# Patient Record
Sex: Male | Born: 1975
Health system: Southern US, Community
[De-identification: ages and names within clinical notes are randomized; demographics above are authoritative.]

## PROBLEM LIST (undated history)

## (undated) DIAGNOSIS — G4733 Obstructive sleep apnea (adult) (pediatric): Secondary | ICD-10-CM

## (undated) DIAGNOSIS — F909 Attention-deficit hyperactivity disorder, unspecified type: Secondary | ICD-10-CM

## (undated) DIAGNOSIS — I10 Essential (primary) hypertension: Secondary | ICD-10-CM

## (undated) DIAGNOSIS — F419 Anxiety disorder, unspecified: Secondary | ICD-10-CM

## (undated) DIAGNOSIS — K219 Gastro-esophageal reflux disease without esophagitis: Secondary | ICD-10-CM

## (undated) HISTORY — DX: Gastro-esophageal reflux disease without esophagitis: K21.9

## (undated) HISTORY — DX: Anxiety disorder, unspecified: F41.9

## (undated) HISTORY — DX: Obstructive sleep apnea (adult) (pediatric): G47.33

## (undated) HISTORY — DX: Essential (primary) hypertension: I10

## (undated) HISTORY — PX: WISDOM TOOTH EXTRACTION: SHX21

---

## 2004-06-11 ENCOUNTER — Ambulatory Visit: Payer: Self-pay | Admitting: Internal Medicine

## 2004-08-12 ENCOUNTER — Ambulatory Visit: Payer: Self-pay | Admitting: Internal Medicine

## 2005-02-22 ENCOUNTER — Ambulatory Visit: Payer: Self-pay | Admitting: Internal Medicine

## 2005-04-06 ENCOUNTER — Ambulatory Visit: Payer: Self-pay | Admitting: Internal Medicine

## 2005-05-10 ENCOUNTER — Ambulatory Visit: Payer: Self-pay | Admitting: Internal Medicine

## 2005-06-03 ENCOUNTER — Ambulatory Visit: Payer: Self-pay | Admitting: Internal Medicine

## 2005-07-08 ENCOUNTER — Ambulatory Visit: Payer: Self-pay | Admitting: Internal Medicine

## 2005-08-18 ENCOUNTER — Ambulatory Visit: Payer: Self-pay | Admitting: Family Medicine

## 2005-09-08 ENCOUNTER — Ambulatory Visit: Payer: Self-pay | Admitting: Internal Medicine

## 2005-10-05 ENCOUNTER — Ambulatory Visit: Payer: Self-pay | Admitting: Internal Medicine

## 2006-05-15 ENCOUNTER — Ambulatory Visit: Payer: Self-pay | Admitting: Internal Medicine

## 2006-06-22 DIAGNOSIS — K219 Gastro-esophageal reflux disease without esophagitis: Secondary | ICD-10-CM

## 2006-06-22 DIAGNOSIS — J309 Allergic rhinitis, unspecified: Secondary | ICD-10-CM

## 2006-06-22 DIAGNOSIS — F419 Anxiety disorder, unspecified: Secondary | ICD-10-CM

## 2006-06-22 DIAGNOSIS — F909 Attention-deficit hyperactivity disorder, unspecified type: Secondary | ICD-10-CM | POA: Insufficient documentation

## 2007-03-05 ENCOUNTER — Ambulatory Visit: Payer: Self-pay | Admitting: Internal Medicine

## 2007-03-05 DIAGNOSIS — J019 Acute sinusitis, unspecified: Secondary | ICD-10-CM

## 2007-03-06 ENCOUNTER — Telehealth: Payer: Self-pay | Admitting: Internal Medicine

## 2007-04-06 ENCOUNTER — Telehealth (INDEPENDENT_AMBULATORY_CARE_PROVIDER_SITE_OTHER): Payer: Self-pay | Admitting: *Deleted

## 2007-04-16 ENCOUNTER — Telehealth (INDEPENDENT_AMBULATORY_CARE_PROVIDER_SITE_OTHER): Payer: Self-pay | Admitting: *Deleted

## 2007-04-19 ENCOUNTER — Telehealth (INDEPENDENT_AMBULATORY_CARE_PROVIDER_SITE_OTHER): Payer: Self-pay | Admitting: *Deleted

## 2007-04-20 ENCOUNTER — Ambulatory Visit: Payer: Self-pay | Admitting: Internal Medicine

## 2007-05-24 ENCOUNTER — Encounter (INDEPENDENT_AMBULATORY_CARE_PROVIDER_SITE_OTHER): Payer: Self-pay | Admitting: *Deleted

## 2007-06-11 ENCOUNTER — Telehealth (INDEPENDENT_AMBULATORY_CARE_PROVIDER_SITE_OTHER): Payer: Self-pay | Admitting: *Deleted

## 2007-07-26 ENCOUNTER — Ambulatory Visit: Payer: Self-pay | Admitting: Internal Medicine

## 2007-07-26 DIAGNOSIS — R002 Palpitations: Secondary | ICD-10-CM

## 2007-07-26 DIAGNOSIS — G4733 Obstructive sleep apnea (adult) (pediatric): Secondary | ICD-10-CM

## 2007-08-02 ENCOUNTER — Ambulatory Visit: Payer: Self-pay | Admitting: Pulmonary Disease

## 2007-08-02 DIAGNOSIS — I10 Essential (primary) hypertension: Secondary | ICD-10-CM

## 2007-08-04 ENCOUNTER — Encounter: Payer: Self-pay | Admitting: Pulmonary Disease

## 2007-08-04 ENCOUNTER — Ambulatory Visit (HOSPITAL_BASED_OUTPATIENT_CLINIC_OR_DEPARTMENT_OTHER): Admission: RE | Admit: 2007-08-04 | Discharge: 2007-08-04 | Payer: Self-pay | Admitting: Pulmonary Disease

## 2007-08-22 ENCOUNTER — Telehealth: Payer: Self-pay | Admitting: Pulmonary Disease

## 2007-08-30 ENCOUNTER — Ambulatory Visit: Payer: Self-pay | Admitting: Pulmonary Disease

## 2007-08-30 ENCOUNTER — Telehealth (INDEPENDENT_AMBULATORY_CARE_PROVIDER_SITE_OTHER): Payer: Self-pay | Admitting: *Deleted

## 2007-09-13 ENCOUNTER — Ambulatory Visit: Payer: Self-pay | Admitting: Pulmonary Disease

## 2007-09-25 ENCOUNTER — Telehealth (INDEPENDENT_AMBULATORY_CARE_PROVIDER_SITE_OTHER): Payer: Self-pay | Admitting: *Deleted

## 2007-11-30 ENCOUNTER — Telehealth: Payer: Self-pay | Admitting: Internal Medicine

## 2007-12-04 ENCOUNTER — Encounter (INDEPENDENT_AMBULATORY_CARE_PROVIDER_SITE_OTHER): Payer: Self-pay | Admitting: *Deleted

## 2007-12-21 ENCOUNTER — Ambulatory Visit: Payer: Self-pay | Admitting: Pulmonary Disease

## 2008-01-09 ENCOUNTER — Encounter: Payer: Self-pay | Admitting: Pulmonary Disease

## 2008-01-28 ENCOUNTER — Ambulatory Visit: Payer: Self-pay | Admitting: Internal Medicine

## 2008-02-01 ENCOUNTER — Ambulatory Visit: Payer: Self-pay | Admitting: Family Medicine

## 2008-02-01 DIAGNOSIS — IMO0002 Reserved for concepts with insufficient information to code with codable children: Secondary | ICD-10-CM

## 2008-02-01 DIAGNOSIS — M545 Low back pain, unspecified: Secondary | ICD-10-CM | POA: Insufficient documentation

## 2008-02-15 ENCOUNTER — Telehealth: Payer: Self-pay | Admitting: Internal Medicine

## 2008-05-29 ENCOUNTER — Ambulatory Visit: Payer: Self-pay | Admitting: Internal Medicine

## 2008-06-19 ENCOUNTER — Telehealth: Payer: Self-pay | Admitting: Internal Medicine

## 2008-07-04 ENCOUNTER — Encounter: Admission: RE | Admit: 2008-07-04 | Discharge: 2008-07-04 | Payer: Self-pay | Admitting: Family Medicine

## 2008-07-04 ENCOUNTER — Ambulatory Visit: Payer: Self-pay | Admitting: Family Medicine

## 2008-07-05 ENCOUNTER — Encounter: Admission: RE | Admit: 2008-07-05 | Discharge: 2008-07-05 | Payer: Self-pay | Admitting: Family Medicine

## 2008-07-07 ENCOUNTER — Telehealth: Payer: Self-pay | Admitting: Family Medicine

## 2008-07-15 ENCOUNTER — Encounter: Payer: Self-pay | Admitting: Family Medicine

## 2008-07-23 ENCOUNTER — Ambulatory Visit (HOSPITAL_COMMUNITY): Admission: RE | Admit: 2008-07-23 | Discharge: 2008-07-24 | Payer: Self-pay | Admitting: Neurosurgery

## 2008-07-25 ENCOUNTER — Telehealth: Payer: Self-pay | Admitting: Family Medicine

## 2008-07-26 HISTORY — PX: LUMBAR DISC SURGERY: SHX700

## 2008-08-12 ENCOUNTER — Telehealth: Payer: Self-pay | Admitting: Internal Medicine

## 2008-10-06 ENCOUNTER — Telehealth: Payer: Self-pay | Admitting: Family Medicine

## 2008-10-07 ENCOUNTER — Telehealth: Payer: Self-pay | Admitting: Internal Medicine

## 2008-10-13 ENCOUNTER — Encounter: Payer: Self-pay | Admitting: Internal Medicine

## 2008-11-21 ENCOUNTER — Telehealth: Payer: Self-pay | Admitting: Internal Medicine

## 2008-11-21 ENCOUNTER — Encounter: Payer: Self-pay | Admitting: Internal Medicine

## 2008-12-23 ENCOUNTER — Ambulatory Visit: Payer: Self-pay | Admitting: Otolaryngology

## 2008-12-26 HISTORY — PX: NASAL SINUS SURGERY: SHX719

## 2009-02-16 ENCOUNTER — Ambulatory Visit: Payer: Self-pay | Admitting: Pulmonary Disease

## 2009-03-13 ENCOUNTER — Telehealth: Payer: Self-pay | Admitting: Family Medicine

## 2009-03-16 ENCOUNTER — Ambulatory Visit: Payer: Self-pay | Admitting: Internal Medicine

## 2009-03-16 DIAGNOSIS — L738 Other specified follicular disorders: Secondary | ICD-10-CM

## 2009-03-18 LAB — CONVERTED CEMR LAB
ALT: 43 units/L (ref 0–53)
BUN: 19 mg/dL (ref 6–23)
Basophils Absolute: 0.1 10*3/uL (ref 0.0–0.1)
Bilirubin, Direct: 0 mg/dL (ref 0.0–0.3)
CO2: 26 meq/L (ref 19–32)
Calcium: 9.3 mg/dL (ref 8.4–10.5)
Creatinine, Ser: 1 mg/dL (ref 0.4–1.5)
Creatinine,U: 126.9 mg/dL
Eosinophils Absolute: 0.3 10*3/uL (ref 0.0–0.7)
Eosinophils Relative: 2.1 % (ref 0.0–5.0)
Glucose, Bld: 70 mg/dL (ref 70–99)
HCT: 44.3 % (ref 39.0–52.0)
Lymphs Abs: 3.2 10*3/uL (ref 0.7–4.0)
MCHC: 33.6 g/dL (ref 30.0–36.0)
MCV: 93.8 fL (ref 78.0–100.0)
Microalb Creat Ratio: 22.1 mg/g (ref 0.0–30.0)
Microalb, Ur: 2.8 mg/dL — ABNORMAL HIGH (ref 0.0–1.9)
Monocytes Absolute: 1.4 10*3/uL — ABNORMAL HIGH (ref 0.1–1.0)
Neutrophils Relative %: 61.7 % (ref 43.0–77.0)
Platelets: 299 10*3/uL (ref 150.0–400.0)
RDW: 12.9 % (ref 11.5–14.6)
Sodium: 141 meq/L (ref 135–145)
Total Bilirubin: 0.9 mg/dL (ref 0.3–1.2)
WBC: 13.1 10*3/uL — ABNORMAL HIGH (ref 4.5–10.5)

## 2009-03-24 ENCOUNTER — Ambulatory Visit: Payer: Self-pay | Admitting: Internal Medicine

## 2009-04-23 ENCOUNTER — Ambulatory Visit: Payer: Self-pay | Admitting: Internal Medicine

## 2009-04-24 LAB — CONVERTED CEMR LAB
Albumin: 4.6 g/dL (ref 3.5–5.2)
Chloride: 103 meq/L (ref 96–112)
GFR calc non Af Amer: 91.35 mL/min (ref >60)
Glucose, Bld: 88 mg/dL (ref 70–99)
Phosphorus: 5.2 mg/dL — ABNORMAL HIGH (ref 2.3–4.6)
Potassium: 4.2 meq/L (ref 3.5–5.1)
Sodium: 140 meq/L (ref 135–145)

## 2009-05-22 ENCOUNTER — Telehealth: Payer: Self-pay | Admitting: Internal Medicine

## 2009-06-12 ENCOUNTER — Telehealth: Payer: Self-pay | Admitting: Internal Medicine

## 2009-06-16 ENCOUNTER — Encounter (INDEPENDENT_AMBULATORY_CARE_PROVIDER_SITE_OTHER): Payer: Self-pay | Admitting: *Deleted

## 2009-07-01 ENCOUNTER — Ambulatory Visit: Payer: Self-pay | Admitting: Internal Medicine

## 2009-07-20 ENCOUNTER — Ambulatory Visit: Payer: Self-pay | Admitting: Internal Medicine

## 2009-07-22 LAB — CONVERTED CEMR LAB
Albumin: 4.8 g/dL (ref 3.5–5.2)
Chloride: 104 meq/L (ref 96–112)
GFR calc non Af Amer: 91.22 mL/min (ref >60)
Glucose, Bld: 93 mg/dL (ref 70–99)
Phosphorus: 4 mg/dL (ref 2.3–4.6)
Potassium: 4.7 meq/L (ref 3.5–5.1)

## 2009-08-07 ENCOUNTER — Telehealth: Payer: Self-pay | Admitting: Internal Medicine

## 2009-08-28 ENCOUNTER — Telehealth (INDEPENDENT_AMBULATORY_CARE_PROVIDER_SITE_OTHER): Payer: Self-pay | Admitting: *Deleted

## 2009-10-27 ENCOUNTER — Telehealth: Payer: Self-pay | Admitting: Family Medicine

## 2009-12-04 ENCOUNTER — Telehealth: Payer: Self-pay | Admitting: Internal Medicine

## 2009-12-29 ENCOUNTER — Ambulatory Visit: Payer: Self-pay | Admitting: Internal Medicine

## 2010-03-09 ENCOUNTER — Encounter: Payer: Self-pay | Admitting: Internal Medicine

## 2010-03-17 ENCOUNTER — Telehealth: Payer: Self-pay | Admitting: Internal Medicine

## 2010-03-18 ENCOUNTER — Encounter: Payer: Self-pay | Admitting: Internal Medicine

## 2010-03-28 HISTORY — PX: LUMBAR DISC SURGERY: SHX700

## 2010-04-06 ENCOUNTER — Ambulatory Visit: Admit: 2010-04-06 | Payer: Self-pay | Admitting: Internal Medicine

## 2010-04-12 ENCOUNTER — Ambulatory Visit
Admission: RE | Admit: 2010-04-12 | Discharge: 2010-04-12 | Payer: Self-pay | Source: Home / Self Care | Attending: Internal Medicine | Admitting: Internal Medicine

## 2010-04-25 LAB — CONVERTED CEMR LAB
Albumin: 4.2 g/dL (ref 3.5–5.2)
Alkaline Phosphatase: 95 units/L (ref 39–117)
Basophils Absolute: 0.1 10*3/uL (ref 0.0–0.1)
Basophils Relative: 0.9 % (ref 0.0–1.0)
Bilirubin, Direct: 0.1 mg/dL (ref 0.0–0.3)
CO2: 30 meq/L (ref 19–32)
Calcium: 9.2 mg/dL (ref 8.4–10.5)
Chloride: 103 meq/L (ref 96–112)
Creatinine, Ser: 1 mg/dL (ref 0.4–1.5)
Eosinophils Absolute: 0.5 10*3/uL (ref 0.0–0.7)
GFR calc Af Amer: 112 mL/min
Glucose, Bld: 91 mg/dL (ref 70–99)
MCHC: 34.2 g/dL (ref 30.0–36.0)
MCV: 89.5 fL (ref 78.0–100.0)
Neutrophils Relative %: 53.9 % (ref 43.0–77.0)
Phosphorus: 4.4 mg/dL (ref 2.3–4.6)
Platelets: 265 10*3/uL (ref 150–400)
RDW: 12.9 % (ref 11.5–14.6)
Total Bilirubin: 0.7 mg/dL (ref 0.3–1.2)

## 2010-04-26 ENCOUNTER — Encounter: Payer: Self-pay | Admitting: Internal Medicine

## 2010-04-27 NOTE — Assessment & Plan Note (Signed)
Summary: 4 WK F/U DLO   Vital Signs:  Patient profile:   35 year old male Weight:      301 pounds BMI:     39.86 Temp:     99.6 degrees F oral Pulse rate:   80 / minute Pulse rhythm:   regular BP sitting:   140 / 100  (left arm) Cuff size:   large  Vitals Entered By: Mervin Hack CMA Duncan Dull) (April 23, 2009 4:14 PM) CC: 4 week follow-up   History of Present Illness: Doing okay Notes a tickle in his throat comes on several times a day--occ dry cough  No headaches No chest pain Notes increased heart rate after reclining after dinner getting reflux  prilosec helps but only twice a week discussed light meals and staying upright  Allergies: 1)  Hycodan  Past History:  Past medical, surgical, family and social histories (including risk factors) reviewed for relevance to current acute and chronic problems.  Past Medical History: Reviewed history from 01/28/2008 and no changes required. Allergic rhinitis Anxiety GERD Hypertension Obstructive sleep apnea  Past Surgical History: Reviewed history from 03/16/2009 and no changes required. Diskectomy--5/10    Dr Franky Macho Sinus surgery--10/10    Dr Willeen Cass  Family History: Reviewed history from 03/16/2009 and no changes required. Dad has sleep apnea, HTN Mom died of breast cancer @55  1 sister CAD is in family No DM No prostate or colon cancer  Social History: Reviewed history from 03/16/2009 and no changes required. 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   Neck:  supple, no masses, no thyromegaly, no carotid bruits, and no cervical lymphadenopathy.   Lungs:  normal respiratory  effort and normal breath sounds.   Heart:  normal rate, regular rhythm, no murmur, and no gallop.   Extremities:  no edema Psych:  normally interactive, good eye contact, not anxious appearing, and not depressed appearing.     Impression & Recommendations:  Problem # 1:  HYPERTENSION (ICD-401.9) Assessment Improved  I got 138/94 on right will continue current med without changing mild throat symptoms which don't necessitate change if worsens, he will call and we will switch to losartan  His updated medication list for this problem includes:    Lisinopril-hydrochlorothiazide 10-12.5 Mg Tabs (Lisinopril-hydrochlorothiazide) .Marland Kitchen... 1 tab daily for high blood pressure  BP today: 140/100 Prior BP: 160/120 (03/24/2009)  Labs Reviewed: K+: 4.3 (03/16/2009) Creat: : 1.0 (03/16/2009)     Orders: TLB-Renal Function Panel (80069-RENAL)  Complete Medication List: 1)  Clonazepam 0.5 Mg Tabs (Clonazepam) .Marland Kitchen.. 1 by mouth two times a day as needed 2)  Diclofenac Sodium 75 Mg Tbec (Diclofenac sodium) .Marland Kitchen.. 1 by mouth two times a day as needed for pain 3)  Fluticasone Propionate 50 Mcg/act Susp (Fluticasone propionate) .... 2 sprays in each nostril daily 4)  Lisinopril-hydrochlorothiazide 10-12.5 Mg Tabs (Lisinopril-hydrochlorothiazide) .Marland Kitchen.. 1 tab daily for high blood pressure  Patient Instructions: 1)  Please schedule a follow-up appointment in 6 months .  Prescriptions: DICLOFENAC SODIUM 75 MG TBEC (DICLOFENAC SODIUM) 1 by mouth two times a day as needed for pain  #60 x 5   Entered and Authorized by:   Cindee Salt MD   Signed  by:   Cindee Salt MD on 04/23/2009   Method used:   Print then Give to Patient   RxID:   [id number] CLONAZEPAM 0.5 MG  TABS (CLONAZEPAM) 1 by mouth two times a day as needed  #60 x 1   Entered and Authorized by:   Cindee Salt MD   Signed by:   Cindee Salt MD on 04/23/2009   Method used:   Print then Give to Patient   RxID:    [id number]   Current Allergies (reviewed today): HYCODAN

## 2010-04-27 NOTE — Progress Notes (Signed)
  Phone Note Other Incoming   Request: Send information Summary of Call: Request for records received from [facility]. Request forwarded to [facility].     

## 2010-04-27 NOTE — Progress Notes (Signed)
Summary: refill request for clonazepam  Phone Note Refill Request Message from:  Fax from Pharmacy  Refills Requested: Medication #1:  CLONAZEPAM 0.5 MG  TABS 1 by mouth two times a day as needed   Last Refilled: 06/16/2009 Faxed request from [place] pharmacy is on your desk.  Initial call taken by: Lowella Petties CMA,  Aug 07, 2009 1:25 PM  Follow-up for Phone Call        okay #60 x 1 Follow-up by: Cindee Salt MD,  Aug 07, 2009 1:36 PM  Additional Follow-up for Phone Call Additional follow up Details #1::        Rx faxed to pharmacy Additional Follow-up by: DeShannon Smith CMA Duncan Dull),  Aug 07, 2009 2:23 PM    Prescriptions: CLONAZEPAM 0.5 MG  TABS (CLONAZEPAM) 1 by mouth two times a day as needed  #60 x 1   Entered by:   Mervin Hack CMA (AAMA)   Authorized by:   Cindee Salt MD   Signed by:   Mervin Hack CMA (AAMA) on 08/07/2009   Method used:   Handwritten   RxID:   [id number]

## 2010-04-27 NOTE — Progress Notes (Signed)
Summary:  LISINOPRIL-HYDROCHLOROTHIAZIDE  Phone Note Refill Request Message from:  Pharmacy on May 22, 2009 1:08 PM  Refills Requested: Medication #1:  LISINOPRIL-HYDROCHLOROTHIAZIDE 10-12.5 MG TABS 1 tab daily for high blood pressure. fax from pharmacy: pt states that lisinopril/hctz is making him cough and would like something different called in. Form on your desk   Initial call taken by: Mervin Hack CMA Duncan Dull),  May 22, 2009 1:08 PM  Follow-up for Phone Call        please call Rx changed have him schedule renal panel in about 3-4 weeks Follow-up by: Cindee Salt MD,  May 22, 2009 1:15 PM  Additional Follow-up for Phone Call Additional follow up Details #1::        Patient notified as instructed, lab work scheduled for 06/16/2009 at 9:15. Additional Follow-up by: Linde Gillis CMA Duncan Dull),  May 22, 2009 3:19 PM    New/Updated Medications: LOSARTAN POTASSIUM-HCTZ 50-12.5 MG TABS (LOSARTAN POTASSIUM-HCTZ) 1 daily for high blood pressure Prescriptions: LOSARTAN POTASSIUM-HCTZ 50-12.5 MG TABS (LOSARTAN POTASSIUM-HCTZ) 1 daily for high blood pressure  #30 x 12   Entered and Authorized by:   Cindee Salt MD   Signed by:   Cindee Salt MD on 05/22/2009   Method used:   Electronically to        [facility]* (retail)       [address]       [place], [place]  [address]       Ph: [phone]       Fax: [phone]   RxID:   [id number]

## 2010-04-27 NOTE — Progress Notes (Signed)
Summary: refill request for clonazepam  Phone Note Refill Request Message from:  Fax from Pharmacy  Refills Requested: Medication #1:  CLONAZEPAM 0.5 MG  TABS 1 by mouth two times a day as needed   Last Refilled: 10/02/2009 Faxed request from [facility] pharmacy.  [phone]  Initial call taken by: Lowella Petties CMA,  October 27, 2009 1:09 PM  Follow-up for Phone Call        Rx called to pharmacy Follow-up by: Benny Lennert CMA Duncan Dull),  October 27, 2009 2:44 PM    Prescriptions: CLONAZEPAM 0.5 MG  TABS (CLONAZEPAM) 1 by mouth two times a day as needed  #60 x 0   Entered and Authorized by:   Kerby Nora MD   Signed by:   Kerby Nora MD on 10/27/2009   Method used:   Telephoned to ...       [facility]* (retail)       [address]       [place], [place]  [address]       Ph: [phone]       Fax: [phone]   RxID:   [id number]

## 2010-04-27 NOTE — Assessment & Plan Note (Signed)
Summary: ? CONJUNCTIVITIS/ 12:45 per Dr. Alphonsus Sias   Vital Signs:  Patient profile:   35 year old male Weight:      306 pounds Temp:     98.4 degrees F oral BP sitting:   148 / 98  (left arm) Cuff size:   large  Vitals Entered By: Mervin Hack CMA Duncan Dull) (July 01, 2009 1:33 PM) CC: pinkeye?   History of Present Illness: Started with pink eye on right yesterday AM [place] when awoke Now into the other eye by later in the day  Some pressure and mild swelling not itching  No contacs No trauma  No known exposures  No fever slight cough and sneezing--some green nasal disharge and cough (productive at times) going on 2 weeks or so no meds for this  Allergies: 1)  Hycodan  Past History:  Past medical, surgical, family and social histories (including risk factors) reviewed for relevance to current acute and chronic problems.  Past Medical History: Reviewed history from 01/28/2008 and no changes required. Allergic rhinitis Anxiety GERD Hypertension Obstructive sleep apnea  Past Surgical History: Reviewed history from 03/16/2009 and no changes required. Diskectomy--5/10    Dr Franky Macho Sinus surgery--10/10    Dr Willeen Cass  Family History: Reviewed history from 03/16/2009 and no changes required. Dad has sleep apnea, HTN Mom died of breast cancer @55  1 sister CAD is in family No DM No prostate or colon cancer  Social History: Reviewed history from 03/16/2009 and no changes required. 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  NAD Head:  no sinus tenderness Eyes:  mild periorbital swelling and appears pink Bilateral tarsal and bulbar injection Ears:  R ear normal and L ear normal.    Nose:  moderate congestion Mouth:  no erythema and no exudates.   Neck:  supple, no masses, and no cervical lymphadenopathy.   Lungs:  normal respiratory effort and normal breath sounds.     Impression & Recommendations:  Problem # 1:  CONJUNCTIVITIS (ICD-372.30) Assessment New  appears to be secondary to apparent sinusitis some periorbitall changes but not clearly cellulitis  will treat with drops and augmentin  His updated medication list for this problem includes:    Sulfacetamide Sodium 10 % Soln (Sulfacetamide sodium) .Marland Kitchen... 2 drops in each eye four times daily for 5 days  Complete Medication List: 1)  Clonazepam 0.5 Mg Tabs (Clonazepam) .Marland Kitchen.. 1 by mouth two times a day as needed 2)  Diclofenac Sodium 75 Mg Tbec (Diclofenac sodium) .Marland Kitchen.. 1 by mouth two times a day as needed for pain 3)  Fluticasone Propionate 50 Mcg/act Susp (Fluticasone propionate) .... 2 sprays in each nostril daily 4)  Triamterene-hctz 37.5-25 Mg Tabs (Triamterene-hctz) .... Take 1 by mouth once daily 5)  Amoxicillin-pot Clavulanate 875-125 Mg Tabs (Amoxicillin-pot clavulanate) .Marland Kitchen.. 1 tab by mouth two times a day with food for sinus infection 6)  Sulfacetamide Sodium 10 % Soln (Sulfacetamide sodium) .... 2 drops in each eye four times daily for 5 days  Patient Instructions: 1)  Please keep appt on April 25th Prescriptions: SULFACETAMIDE SODIUM 10 % SOLN (SULFACETAMIDE SODIUM) 2 drops in each eye four times daily for 5 days  #1 bottle x 0   Entered and Authorized by:   Gerlene Burdock  Dia Crawford MD   Signed by:   Cindee Salt MD on 07/01/2009   Method used:   Electronically to        [facility]* (retail)       [address]       [place], [place]  [address]       Ph: [phone]       Fax: [phone]   RxID:   [id number] AMOXICILLIN-POT CLAVULANATE 875-125 MG TABS (AMOXICILLIN-POT CLAVULANATE) 1 tab by mouth two times a day with food for sinus infection  #20 x 0   Entered and Authorized by:    Cindee Salt MD   Signed by:   Cindee Salt MD on 07/01/2009   Method used:   Electronically to        [facility]* (retail)       [address]       [place], [place]  [address]       Ph: [phone]       Fax: [phone]   RxID:   [id number]   Current Allergies (reviewed today): HYCODAN

## 2010-04-27 NOTE — Progress Notes (Signed)
Summary: clonazepam  Phone Note Refill Request Message from:  Fax from Pharmacy on December 04, 2009 9:45 AM  Refills Requested: Medication #1:  CLONAZEPAM 0.5 MG  TABS 1 by mouth two times a day as needed   Last Refilled: 10/27/2009 Refill request from [facility]. Form is on your desk.  [phone]  Initial call taken by: Melody Comas,  December 04, 2009 9:46 AM  Follow-up for Phone Call        okay #60 x 0 Needs to schedule appt for follow up Follow-up by: Cindee Salt MD,  December 04, 2009 1:34 PM  Additional Follow-up for Phone Call Additional follow up Details #1::        Rx faxed to pharmacy, pt scheduled appt for 12/29/2009 Additional Follow-up by: DeShannon Smith CMA Duncan Dull),  December 04, 2009 2:25 PM    Prescriptions: CLONAZEPAM 0.5 MG  TABS (CLONAZEPAM) 1 by mouth two times a day as needed  #60 x 0   Entered by:   Mervin Hack CMA (AAMA)   Authorized by:   Cindee Salt MD   Signed by:   Mervin Hack CMA (AAMA) on 12/04/2009   Method used:   Handwritten   RxID:   [id number]

## 2010-04-27 NOTE — Assessment & Plan Note (Signed)
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 Has had some job offers but limited due to needs for getting daughter back and forth to school  Weight was down at first but now up 16# since last visit Working with trainer twice a week--very aggressive Has been retaining a lot of fluid Tries to eat 5 times per day per trainer's recommendation Feels he is in better shape  No chest pain No palpitations No SOB Has been a salt user at times--now trying substitute  No stomach problems  Mood okay not depressed  No problems with anxiety  Allergies: 1)  Hycodan 2)  Lisinopril (Lisinopril) 3)  Cozaar (Losartan Potassium)  Past History:  Past medical, surgical, family and social histories (including risk factors) reviewed for relevance to current acute and chronic problems.  Past Medical History: Reviewed history from 01/28/2008 and no changes required. Allergic rhinitis Anxiety GERD Hypertension Obstructive sleep apnea  Past Surgical History: Reviewed history from 03/16/2009 and no changes required. Diskectomy--5/10    Dr Franky Macho Sinus surgery--10/10    Dr Willeen Cass  Family History: Reviewed history from 03/16/2009 and no changes required. Dad has sleep apnea, HTN Mom died of breast cancer @55  1 sister CAD is in family No DM No prostate or colon cancer  Social History: Reviewed history from 03/16/2009 and no changes required. 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   Neck:  supple, no masses, no thyromegaly, no carotid bruits, and no cervical lymphadenopathy.   Lungs:  normal respiratory effort, no intercostal retractions, no accessory muscle use, and normal breath sounds.   Heart:  normal rate, regular rhythm, no murmur, and no gallop.   Abdomen:  soft and non-tender.   Extremities:  trace edema in ankles Psych:  normally interactive, good eye contact, not anxious appearing, and not depressed appearing.     Impression & Recommendations:  Problem # 1:  HYPERTENSION (ICD-401.9) Assessment Deteriorated repeat 150/106 by me didn't take his meds today has just given up salt needs another med but determined to do better with lifestyle will wait another 3 months then add ziac as needed   His updated medication list for this problem includes:    Triamterene-hctz 37.5-25 Mg Tabs (Triamterene-hctz) .Marland Kitchen... Take 1 by mouth once daily  BP today: 160/110 Prior BP: 140/100 (07/20/2009)  Labs Reviewed: K+: 4.7 (07/20/2009) Creat: : 1.0 (07/20/2009)     Problem # 2:  GERD (ICD-530.81) Assessment: Unchanged has been okay without meds  Problem # 3:  ANXIETY (ICD-300.00) Assessment: Improved anxiety is okay despite imminent job loss looking forward to going back to school  but needs focus may try tne clonazepam at night since flexeril doesn't work well  His updated medication list for this problem includes:    Clonazepam 0.5 Mg Tabs (Clonazepam) .Marland Kitchen... 1 by mouth two times a day as needed  Complete Medication List: 1)  Clonazepam 0.5 Mg Tabs (Clonazepam) .Marland Kitchen.. 1 by mouth two times a day as needed 2)  Diclofenac Sodium 75 Mg Tbec (Diclofenac sodium) .Marland Kitchen.. 1  by mouth two times a day as needed for pain 3)  Fluticasone Propionate 50 Mcg/act Susp (Fluticasone propionate) .... 2 sprays in each nostril daily 4)  Triamterene-hctz 37.5-25 Mg Tabs (Triamterene-hctz) .... Take 1 by mouth once daily  Patient Instructions: 1)  Please schedule a follow-up appointment in 3 months .   Current Allergies (reviewed today): HYCODAN LISINOPRIL (LISINOPRIL) COZAAR (LOSARTAN POTASSIUM)

## 2010-04-27 NOTE — Assessment & Plan Note (Signed)
Summary: BP follow-up/ds   Vital Signs:  Patient profile:   35 year old male Weight:      309 pounds Temp:     98.3 degrees F oral Pulse rate:   88 / minute Pulse rhythm:   regular BP sitting:   140 / 100  Vitals Entered By: Mervin Hack CMA Duncan Dull) (July 20, 2009 11:16 AM)  Serial Vital Signs/Assessments:  Time      Position  BP       Pulse  Resp  Temp     By           R Arm     148/108                        Cindee Salt MD  CC: blood pressure elevated   History of Present Illness: sinuses still congested but "like they usually are" Conjunctivitis is better using OTC antihistamine now for tree pollen season  No problems with current BP med Still voids a lot---discussed decreasing salt since he still uses Weight down but now back up again Not really working on fitness  Schedule is difficult due to care of his daughter  No chest pain No SOB no sig edema  Ongoing sleep problems has repetitive back pain can't get comfortable in bed rolls around all night and then doesn't sleep uses the diclofenac--doesn't help and eventually starts bothering his stomach  Allergies: 1)  Hycodan 2)  Lisinopril (Lisinopril) 3)  Cozaar (Losartan Potassium)  Past History:  Past medical, surgical, family and social histories (including risk factors) reviewed for relevance to current acute and chronic problems.  Past Medical History: Reviewed history from 01/28/2008 and no changes required. Allergic rhinitis Anxiety GERD Hypertension Obstructive sleep apnea  Past Surgical History: Reviewed history from 03/16/2009 and no changes required. Diskectomy--5/10    Dr Franky Macho Sinus surgery--10/10    Dr Willeen Cass  Family History: Reviewed history from 03/16/2009 and no changes required. Dad has sleep apnea, HTN Mom died of breast cancer @55  1 sister CAD is in family No DM No prostate or colon cancer  Social History: Reviewed history from 03/16/2009 and no changes  required. 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         weight back up some bowels are okay  Physical Exam  General:  alert and normal appearance.   Neck:  supple, no masses, no thyromegaly, and no cervical lymphadenopathy.   Lungs:  normal respiratory effort and normal breath sounds.   Heart:  normal rate, regular rhythm, no murmur, and no gallop.   Extremities:  no edema Psych:  normally interactive, good eye contact, not anxious appearing, and not depressed appearing.     Impression & Recommendations:  Problem # 1:  HYPERTENSION (ICD-401.9) Assessment Unchanged  no better discussed and he will really work on lifestyle will add beta blocker next time if no better  His updated medication list for this problem includes:    Triamterene-hctz 37.5-25 Mg Tabs (Triamterene-hctz) .Marland Kitchen... Take 1 by mouth once daily  BP today: 140/100 Prior BP: 148/98 (07/01/2009)  Labs Reviewed: K+: 4.2 (04/23/2009) Creat: : 1.0 (04/23/2009)     Orders: TLB-Renal Function Panel (80069-RENAL) Venipuncture ([address])  Complete Medication List: 1)  Clonazepam 0.5 Mg Tabs (Clonazepam) .Marland KitchenMarland KitchenMarland Kitchen  1 by mouth two times a day as needed 2)  Diclofenac Sodium 75 Mg Tbec (Diclofenac sodium) .Marland Kitchen.. 1 by mouth two times a day as needed for pain 3)  Fluticasone Propionate 50 Mcg/act Susp (Fluticasone propionate) .... 2 sprays in each nostril daily 4)  Triamterene-hctz 37.5-25 Mg Tabs (Triamterene-hctz) .... Take 1 by mouth once daily  Patient Instructions: 1)  Please schedule a follow-up appointment in 4 months .   Current Allergies (reviewed today): HYCODAN LISINOPRIL (LISINOPRIL) COZAAR (LOSARTAN POTASSIUM)

## 2010-04-27 NOTE — Letter (Signed)
Summary: [place] No Show Letter  [place] [facility]  [address] [place], [place] [address]   Phone: [phone]  Fax: [phone]    06/16/2009 MRN: [id number]  [facility] [place] [address] [place], [place]  [address]   Dear Mr. Torti,   Our records indicate that you missed your scheduled appointment with ___lab__________________ on __3.22.11__________.  Please contact this office to reschedule your appointment as soon as possible.  It is important that you keep your scheduled appointments with your physician, so we can provide you the best care possible.  Please be advised that there may be a charge for "no show" appointments.    Sincerely,   [place] [facility]

## 2010-04-27 NOTE — Progress Notes (Signed)
Summary: losarta   Phone Note From Pharmacy Call back at [phone]   Caller: [facility] pharmacy  Call For: Dr. Alphonsus Sias  Summary of Call: Patient says that after switching to [place] Saliva is has caused him to have a cough the same as the Lisinopril did. Pharmacy wants to know if he can have something else in the place of it.  Initial call taken by: Melody Comas,  June 12, 2009 4:26 PM  Follow-up for Phone Call        please ask him to wait about 2-3 weeks because the cough from lisinopril can linger for weeks or more and gradually fade away and cough with losartan if pretty infrequent Follow-up by: Cindee Salt MD,  June 13, 2009 7:50 AM  Additional Follow-up for Phone Call Additional follow up Details #1::        voicemail has not been set up for [phone], called work number and pt was not there, will try later. DeShannon Smith CMA Duncan Dull)  June 15, 2009 4:12 PM   called work number again, pt was not in, no answer at home number, will try later. DeShannon Smith CMA Duncan Dull)  June 16, 2009 9:35 AM   still no answer from pt at home or work, will wait for pt to call the office. Additional Follow-up by: Mervin Hack CMA Duncan Dull),  June 17, 2009 11:49 AM    Additional Follow-up for Phone Call Additional follow up Details #2::    Pt called office, asks that we call him back at [phone]- cell number listed in chart.  I advised pt of a possible lingering cough for a few weeks after stopping lisinopril.  He says he had been off the lisinopril for 2 weeks before starting the losartan and he has been off the losartan for 2 weeks. He says he doesnt want to continue to cough.  Still asks if something else should be called in in place of losartan  Please advise.  He knows Dr. Alphonsus Sias is out until monday.             Lowella Petties CMA  June 18, 2009 4:45 PM  Have him try just triamterene/HCTZ  37.5/25 daily set up appt in about 3-4 weeks to see how he is doing. He may need 2 meds to take  the place of the double med he was on Cindee Salt MD  June 18, 2009 5:33 PM   spoke with pt and set up appt for 3-4 weeks, also sent new rx to pharmacy.  Follow-up by: Mervin Hack CMA Duncan Dull),  June 22, 2009 10:03 AM  New/Updated Medications: TRIAMTERENE-HCTZ 37.5-25 MG TABS (TRIAMTERENE-HCTZ) take 1 by mouth once daily Prescriptions: TRIAMTERENE-HCTZ 37.5-25 MG TABS (TRIAMTERENE-HCTZ) take 1 by mouth once daily  #30 x 12   Entered by:   Mervin Hack CMA (AAMA)   Authorized by:   Cindee Salt MD   Signed by:   Mervin Hack CMA (AAMA) on 06/22/2009   Method used:   Electronically to        [facility]* (retail)       [address]       [place], [place]  [address]       Ph: [phone]       Fax: [phone]   RxID:   [id number]

## 2010-04-29 NOTE — Letter (Signed)
Summary: [facility]  [facility]   Imported By: Lanelle Bal 03/30/2010 13:54:00  _____________________________________________________________________  External Attachment:    Type:   Image     Comment:   External Document  Appended Document: [facility] increased right leg pain checking MRI again

## 2010-04-29 NOTE — Assessment & Plan Note (Signed)
Summary: 3 month f/u/alc   Vital Signs:  Patient profile:   35 year old male Weight:      317 pounds BMI:     41.97 Temp:     98.7 degrees F oral Pulse rate:   72 / minute Pulse rhythm:   regular BP sitting:   152 / 108  (left arm) Cuff size:   large  Vitals Entered By: Mervin Hack CMA Duncan Dull) (April 12, 2010 11:06 AM) CC: 3 month follow-up   History of Present Illness: Had discectomy recently Concerned because he was told he couldn't lift more than 20# ever again He will have to discuss with surgeon Pain is better  hasn't been able to work out in the past few weeks  Doesn't check BP No headaches BP was high for the surgery  No chest pain No SOB  No gout but it runs in family  Starting new job in 2 weeks at another [employer] will be busy and on his feet a lot  Allergies: 1)  Hycodan 2)  Lisinopril (Lisinopril) 3)  Cozaar (Losartan Potassium)  Past History:  Past medical, surgical, family and social histories (including risk factors) reviewed for relevance to current acute and chronic problems.  Past Medical History: Reviewed history from 01/28/2008 and no changes required. Allergic rhinitis Anxiety GERD Hypertension Obstructive sleep apnea  Past Surgical History: Diskectomy--5/10    Dr Franky Macho Sinus surgery--10/10    Dr Willeen Cass Discectomy L4-5  1/12  Family History: Reviewed history from 03/16/2009 and no changes required. Dad has sleep apnea, HTN Mom died of breast cancer @55  1 sister CAD is in family No DM No prostate or colon cancer  Social History: Reviewed history from 03/16/2009 and no changes required. Occupation: [employer] Now starting supervisor position Divorced--1 daughter--now with full custody Current Smoker--has been cutting down Alcohol use--quit after 3 DUIs--now drinks 6-8/night when doesn't have his daughter  Review of Systems       eating more frequently but trying to be careful with what he is  eating Working hard on processing deer meat still----eating on the fly (mostly meat) weight down 8# again  Physical Exam  General:  alert and normal appearance.   Neck:  supple, no masses, no thyromegaly, no carotid bruits, and no cervical lymphadenopathy.   Lungs:  normal respiratory effort, no intercostal retractions, no accessory muscle use, and normal breath sounds.   Heart:  normal rate, regular rhythm, no murmur, and no gallop.   Extremities:  no edema Psych:  normally interactive, good eye contact, not anxious appearing, and not depressed appearing.     Impression & Recommendations:  Problem # 1:  HYPERTENSION (ICD-401.9) Assessment Unchanged no better now even though he is taking the meds will add ziac  His updated medication list for this problem includes:    Triamterene-hctz 37.5-25 Mg Tabs (Triamterene-hctz) .Marland Kitchen... Take 1 by mouth once daily    Bisoprolol-hydrochlorothiazide 5-6.25 Mg Tabs (Bisoprolol-hydrochlorothiazide) .Marland Kitchen... 1 tab daily to help high blood pressure  BP today: 152/108 Prior BP: 160/110 (12/29/2009)  Labs Reviewed: K+: 4.7 (07/20/2009) Creat: : 1.0 (07/20/2009)     Complete Medication List: 1)  Clonazepam 0.5 Mg Tabs (Clonazepam) .Marland Kitchen.. 1 by mouth two times a day as needed 2)  Diclofenac Sodium 75 Mg Tbec (Diclofenac sodium) .Marland Kitchen.. 1 by mouth two times a day as needed for pain 3)  Fluticasone Propionate 50 Mcg/act Susp (Fluticasone propionate) .... 2 sprays in each nostril daily 4)  Triamterene-hctz 37.5-25 Mg Tabs (Triamterene-hctz) .Marland KitchenMarland KitchenMarland Kitchen  Take 1 by mouth once daily 5)  Percocet 7.5-500 Mg Tabs (Oxycodone-acetaminophen) .... Take 1 by mouth 4 times daily 6)  Bisoprolol-hydrochlorothiazide 5-6.25 Mg Tabs (Bisoprolol-hydrochlorothiazide) .Marland Kitchen.. 1 tab daily to help high blood pressure  Patient Instructions: 1)  Please schedule a follow-up appointment in 2-3 months.  Prescriptions: BISOPROLOL-HYDROCHLOROTHIAZIDE 5-6.25 MG TABS  (BISOPROLOL-HYDROCHLOROTHIAZIDE) 1 tab daily to help high blood pressure  #30 x 11   Entered and Authorized by:   Cindee Salt MD   Signed by:   Cindee Salt MD on 04/12/2010   Method used:   Electronically to        [facility]* (retail)       [address]       [place], [place]  [address]       Ph: [phone]       Fax: [phone]   RxID:   [id number]    Orders Added: 1)  Est. Patient Level III [64403]    Current Allergies (reviewed today): HYCODAN LISINOPRIL (LISINOPRIL) COZAAR (LOSARTAN POTASSIUM)

## 2010-04-29 NOTE — Letter (Signed)
Summary: [facility]  [facility]   Imported By: Maryln Gottron 04/01/2010 15:21:14  _____________________________________________________________________  External Attachment:    Type:   Image     Comment:   External Document  Appended Document: [facility] discectomy planned 03/31/10

## 2010-04-29 NOTE — Progress Notes (Signed)
Summary: clonazepam  Phone Note Refill Request Message from:  Fax from Pharmacy on March 17, 2010 11:24 AM  Refills Requested: Medication #1:  CLONAZEPAM 0.5 MG  TABS 1 by mouth two times a day as needed   Last Refilled: 12/04/2009 Refill request from [facility]. [phone]. Fax is on your desk.   Initial call taken by: Melody Comas,  March 17, 2010 11:25 AM  Follow-up for Phone Call        okay #60 x 0 Follow-up by: Cindee Salt MD,  March 17, 2010 1:25 PM  Additional Follow-up for Phone Call Additional follow up Details #1::        Rx faxed to pharmacy Additional Follow-up by: DeShannon Smith CMA Duncan Dull),  March 17, 2010 2:08 PM    Prescriptions: CLONAZEPAM 0.5 MG  TABS (CLONAZEPAM) 1 by mouth two times a day as needed  #60 x 0   Entered by:   Mervin Hack CMA (AAMA)   Authorized by:   Cindee Salt MD   Signed by:   Mervin Hack CMA (AAMA) on 03/17/2010   Method used:   Handwritten   RxID:   [id number]

## 2010-05-12 ENCOUNTER — Encounter: Payer: Self-pay | Admitting: Internal Medicine

## 2010-05-17 ENCOUNTER — Telehealth: Payer: Self-pay | Admitting: Internal Medicine

## 2010-05-25 NOTE — Letter (Signed)
Summary: [facility]  [facility]   Imported By: Kassie Mends 05/18/2010 08:08:33  _____________________________________________________________________  External Attachment:    Type:   Image     Comment:   External Document  Appended Document: [facility] right leg is great now Now with left leg pain

## 2010-05-25 NOTE — Progress Notes (Signed)
Summary: clonazepam  Phone Note Refill Request Message from:  Fax from Pharmacy on May 17, 2010 3:26 PM  Refills Requested: Medication #1:  CLONAZEPAM 0.5 MG  TABS 1 by mouth two times a day as needed   Last Refilled: 03/17/2010 Refill  request from [facility] pharmacy. Fax is on your desk.  [phone]  Initial call taken by: Melody Comas,  May 17, 2010 3:27 PM  Follow-up for Phone Call        okay #60 x 0 Follow-up by: Cindee Salt MD,  May 17, 2010 5:11 PM  Additional Follow-up for Phone Call Additional follow up Details #1::        Rx faxed to pharmacy Additional Follow-up by: DeShannon Smith CMA Duncan Dull),  May 17, 2010 5:25 PM    Prescriptions: CLONAZEPAM 0.5 MG  TABS (CLONAZEPAM) 1 by mouth two times a day as needed  #60 x 0   Entered by:   Mervin Hack CMA (AAMA)   Authorized by:   Cindee Salt MD   Signed by:   Mervin Hack CMA (AAMA) on 05/17/2010   Method used:   Handwritten   RxID:   [id number]

## 2010-06-23 ENCOUNTER — Other Ambulatory Visit: Payer: Self-pay | Admitting: Neurosurgery

## 2010-06-23 DIAGNOSIS — M5416 Radiculopathy, lumbar region: Secondary | ICD-10-CM

## 2010-06-30 ENCOUNTER — Ambulatory Visit
Admission: RE | Admit: 2010-06-30 | Discharge: 2010-06-30 | Disposition: A | Discharge status: Home / Self Care | Payer: BC Managed Care – PPO | Source: Ambulatory Visit | Attending: Neurosurgery | Admitting: Neurosurgery

## 2010-06-30 ENCOUNTER — Other Ambulatory Visit: Payer: Self-pay | Admitting: *Deleted

## 2010-06-30 DIAGNOSIS — M5416 Radiculopathy, lumbar region: Secondary | ICD-10-CM

## 2010-06-30 MED ORDER — TRIAMTERENE-HCTZ 37.5-25 MG PO TABS: 1.0000 | ORAL_TABLET | Freq: Every day | ORAL | Status: DC

## 2010-06-30 MED ORDER — GADOBENATE DIMEGLUMINE 529 MG/ML IV SOLN
20.0000 mL | Freq: Once | INTRAVENOUS | Status: AC | PRN
  Administered 2010-06-30: 20 mL via INTRAVENOUS

## 2010-07-07 LAB — URINALYSIS, ROUTINE W REFLEX MICROSCOPIC
Nitrite: NEGATIVE
Specific Gravity, Urine: 1.026 (ref 1.005–1.030)
Urobilinogen, UA: 1 mg/dL (ref 0.0–1.0)
pH: 7.5 (ref 5.0–8.0)

## 2010-07-07 LAB — CBC
Platelets: 292 10*3/uL (ref 150–400)
RDW: 12.9 % (ref 11.5–15.5)
WBC: 9 10*3/uL (ref 4.0–10.5)

## 2010-07-07 LAB — BASIC METABOLIC PANEL
BUN: 22 mg/dL (ref 6–23)
Calcium: 9.3 mg/dL (ref 8.4–10.5)
GFR calc non Af Amer: >60 mL/min (ref >60)
Glucose, Bld: 94 mg/dL (ref 70–99)
Sodium: 140 mEq/L (ref 135–145)

## 2010-07-09 ENCOUNTER — Encounter: Payer: Self-pay | Admitting: Internal Medicine

## 2010-07-09 ENCOUNTER — Ambulatory Visit (INDEPENDENT_AMBULATORY_CARE_PROVIDER_SITE_OTHER): Payer: BC Managed Care – PPO | Admitting: Internal Medicine

## 2010-07-09 VITALS — BP 129/84 | HR 68 | Temp 98.6°F | Ht 73.0 in | Wt 314.0 lb

## 2010-07-09 DIAGNOSIS — IMO0002 Reserved for concepts with insufficient information to code with codable children: Secondary | ICD-10-CM

## 2010-07-09 DIAGNOSIS — I10 Essential (primary) hypertension: Secondary | ICD-10-CM

## 2010-07-09 DIAGNOSIS — F411 Generalized anxiety disorder: Secondary | ICD-10-CM

## 2010-07-09 DIAGNOSIS — K219 Gastro-esophageal reflux disease without esophagitis: Secondary | ICD-10-CM

## 2010-07-09 MED ORDER — CLONAZEPAM 0.5 MG PO TABS: 0.5000 mg | ORAL_TABLET | Freq: Two times a day (BID) | ORAL | Status: DC | PRN

## 2010-07-09 NOTE — Progress Notes (Signed)
Subjective:    Patient ID: Juan [place], male    DOB: 02/07/1977, 35 y.o.   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  , Disp: , Rfl: ;  clonazePAM (KLONOPIN) 0.5 MG tablet, Take 0.5 mg by mouth 2 (two) times daily as needed.  , Disp: , Rfl: ;  diclofenac (VOLTAREN) 75 MG EC tablet, Take 75 mg by mouth 2 (two) times daily as needed.  , Disp: , Rfl: ;  fluticasone (FLONASE) 50 MCG/ACT nasal spray, 2 sprays in each nostril daily , Disp: , Rfl:  oxyCODONE-acetaminophen (PERCOCET) 7.5-500 MG per tablet, Take 1 tablet by mouth 4 (four) times daily.  , Disp: , Rfl: ;  triamterene-hydrochlorothiazide (MAXZIDE-25) 37.5-25 MG per tablet, Take 1 tablet by mouth daily., Disp: 30 tablet, Rfl: 11  Past Medical History  Diagnosis Date  . Anxiety   . GERD (gastroesophageal reflux disease)   . Hypertension   . Allergic rhinitis   . Obstructive sleep apnea     Past Surgical History  Procedure Date  . Lumbar disc surgery 5/10    Dr. Franky Macho  . Nasal sinus surgery 10/10    Dr. Willeen Cass  . Lumbar disc surgery 1/12    L4-5    Family History  Problem Relation Age of Onset  . Cancer Mother     Breast    History   Social History  . Marital Status: Divorced    Spouse Name: N/A    Number of  Children: 1  . Years of Education: N/A   Occupational History  . Mechanic, Pharmacologist     Now starting supervisory position   Social History Main Topics  . Smoking status: Current Everyday Smoker -- 1.0 packs/day for 17 years    Types: Cigarettes  . Smokeless tobacco: Never Used   Comment: Has been cutting down  . Alcohol Use: No     Quit after 3 DUI's.  Now drinks 6-8 at night when he doesn't have his daughter.  . Drug Use: Not on file  . Sexually Active: Not on file   Other Topics Concern  . Not on file   Social History Narrative   Now with full custody of daughter.   Review of Systems Sleeps is variable--okay if he gets comfortable with back and leg Weight down a few pounds    Objective:   Physical Exam  Constitutional: He appears well-developed and well-nourished.  Neck: Normal range of motion. No thyromegaly present.  Cardiovascular: Normal rate, regular rhythm and normal heart sounds.  Exam reveals no gallop.   No murmur heard. Pulmonary/Chest: Effort normal and breath sounds normal. No respiratory distress. He has no wheezes. He has no rales.  Musculoskeletal: He exhibits no edema and no tenderness.  Lymphadenopathy:    He has no cervical adenopathy.  Psychiatric: He has a  normal mood and affect. His behavior is normal. Judgment and thought content normal.          Assessment & Plan:

## 2010-08-10 NOTE — Procedures (Signed)
NAME:  Juan [place], Juan Franklin NO.:  [id number]   MEDICAL RECORD NO.:  [id number]          PATIENT TYPE:  OUT   LOCATION:  SLEEP CENTER                 FACILITY:  [facility]   PHYSICIAN:  Barbaraann Share, MD,FCCPDATE OF BIRTH:  1976/03/25   DATE OF STUDY:  08/04/2007                            NOCTURNAL POLYSOMNOGRAM   REFERRING PHYSICIAN:  Barbaraann Share, MD,FCCP   INDICATION FOR STUDY:  Hypersomnia with sleep apnea.   EPWORTH SLEEPINESS SCORE:  17.   MEDICATIONS:   SLEEP ARCHITECTURE:  The patient had a total sleep time of 331 minutes  with decreased slow wave sleep as well as REM.  Sleep onset latency was  normal at 21 minutes and REM onset was mildly prolonged at 111 minutes.  Sleep efficiency was decreased at 87% but only mildly.   RESPIRATORY DATA:  The patient was found to have 17 apneas and 106  hypopneas for an apnea-hypopnea index of 22 events per hour.  The events occurred in all body positions and there was loud snoring  throughout.   OXYGEN DATA:  There was O2 desaturation as low as 77% with the patient's  obstructive events.   CARDIAC DATA:  No clinically significant arrhythmias were noted.   MOVEMENT-PARASOMNIA:  There were no leg jerks or abnormal behaviors  noted.   IMPRESSIONS-RECOMMENDATIONS:  Moderate obstructive sleep apnea/hypopnea  syndrome with an apnea-hypopnea index of 22 events per hour and O2  desaturation as low as 77%.  Treatment for this degree of sleep apnea  could include weight loss alone if applicable, upper airway surgery,  oral appliance and also CPAP.  Clinical correlation is suggested.      Barbaraann Share, MD,FCCP  Diplomate, American Board of Sleep  Medicine  Electronically Signed    [facility]/MEDQ  D:  08/30/2007 08:31:05  T:  08/30/2007 09:02:00  Job:  [id number]

## 2010-08-10 NOTE — Op Note (Signed)
NAME:  [name], LOW               ACCOUNT NO.:  [id number]   MEDICAL RECORD NO.:  [id number]          PATIENT TYPE:  OIB   LOCATION:  3538                         FACILITY:  [facility]   PHYSICIAN:  Coletta Memos, M.D.     DATE OF BIRTH:  Apr 06, 1975   DATE OF PROCEDURE:  07/23/2008  DATE OF DISCHARGE:                               OPERATIVE REPORT   PREOPERATIVE DIAGNOSES:  1. Displaced disk, right L3-4.  2. Right L4 radiculopathy.   POSTOPERATIVE DIAGNOSES:  1. Displaced disk, right L3-4.  2. Right L4 radiculopathy.   PROCEDURE:  Right L3-4 semi-hemilaminectomy and diskectomy with  microdissection.   COMPLICATIONS:  None.   SURGEON:  Coletta Memos, MD   ASSISTANT:  Stefani Dama, MD   INDICATIONS:  Juan [place] is a 35 year old who presented with severe  pain in his right lower extremity.  MRI showed a large fragment of disk  and disk herniation at the disk space at L3-4 on the right side.  I  offered and he agreed to undergo operative decompression.  He did have  some weakness in the right quadriceps.   OPERATIVE NOTE:  [name] was brought to the operating room intubated  and placed under general anesthetic without difficulty.  He was rolled  prone onto a Wilson frame and all pressure points were properly padded.  His back was prepped and he was draped in a sterile fashion.  I  infiltrated a 20 mL of 0.5% lidocaine with 1:200,000 strength  epinephrine into the lumbar region.  I opened the skin with a #10 blade  and took this down to the thoracolumbar fascia.  Intraoperative x-ray  showed I was in the correct interlaminar space after placing double-  ended ganglion knife inferior to the lamina that I exposed.  I then  proceeded to perform a semi-hemilaminectomy of L3 using a high-speed  drill and Kerrison punch.  I removed the ligamentum flavum and exposed  the thecal sac between L3 and L4 on the right side.  I retracted the  thecal sac medially and encountered what was  disk material.  I was not  at the disk space but I also was in the area of the fragment as I could  not express it.  I went into the disk space because there was a large  hump there and removed some disk material.  The disk certainly  degenerated.  I then with Dr. Verlee Rossetti assistance probed caudally and we  were able to then remove what was a very large fragment of disk  material.  Having done that, I just removed some more soft tissue from  the disk space.  I then irrigated.  I then closed the wound  reapproximating thoracolumbar subcutaneous and subcuticular layers.  I  used Dermabond for sterile dressing.  He tolerated the procedure well.           ______________________________  Coletta Memos, M.D.     [facility]/MEDQ  D:  07/23/2008  T:  07/24/2008  Job:  [id number]

## 2010-08-13 NOTE — Op Note (Signed)
NAME:  Juan Franklin, Juan Franklin               ACCOUNT NO.:  [id number]   MEDICAL RECORD NO.:  [id number]          PATIENT TYPE:  OIB   LOCATION:  3538                         FACILITY:  [facility]   PHYSICIAN:  Coletta Memos, M.D.     DATE OF BIRTH:  [birth year]   DATE OF PROCEDURE:  DATE OF DISCHARGE:  07/24/2008                               OPERATIVE REPORT   ADDENDUM   This is a correction to the operative report.   Procedure:  Right L3-4, semi-hemilaminectomy and diskectomy with  microdissection.   Microdissection was used by both myself and Dr. Danielle Dess to ensure that  the disk space and the contents of the disk space have been removed and  also to ensure the integrity of the nerve root, which we retracted.           ______________________________  Coletta Memos, M.D.     KC/MEDQ  D:  07/30/2008  T:  07/31/2008  Job:  [id number]

## 2010-08-19 ENCOUNTER — Other Ambulatory Visit: Payer: Self-pay | Admitting: *Deleted

## 2010-08-19 MED ORDER — DICLOFENAC SODIUM 75 MG PO TBEC: 75.0000 mg | DELAYED_RELEASE_TABLET | Freq: Two times a day (BID) | ORAL | Status: DC | PRN

## 2010-08-19 NOTE — Telephone Encounter (Signed)
Faxed request from [facility] pharmacy is on your desk.

## 2010-10-22 ENCOUNTER — Other Ambulatory Visit: Payer: Self-pay | Admitting: *Deleted

## 2010-10-22 NOTE — Telephone Encounter (Signed)
Juan Franklin 

## 2010-10-24 MED ORDER — CLONAZEPAM 0.5 MG PO TABS: 0.5000 mg | ORAL_TABLET | Freq: Two times a day (BID) | ORAL | Status: DC | PRN

## 2010-10-24 NOTE — Telephone Encounter (Signed)
Please call in

## 2010-10-25 NOTE — Telephone Encounter (Signed)
rx called into pharmacy

## 2010-12-27 ENCOUNTER — Other Ambulatory Visit: Payer: Self-pay | Admitting: *Deleted

## 2010-12-27 NOTE — Telephone Encounter (Signed)
Last refill 10/25/2010.  Rx in your IN box.

## 2010-12-28 MED ORDER — CLONAZEPAM 0.5 MG PO TABS: 0.5000 mg | ORAL_TABLET | Freq: Two times a day (BID) | ORAL | Status: DC | PRN

## 2010-12-28 NOTE — Telephone Encounter (Signed)
Okay #60 x 0 

## 2010-12-28 NOTE — Telephone Encounter (Signed)
rx faxed to pharmacy manually  

## 2011-01-11 ENCOUNTER — Ambulatory Visit: Payer: BC Managed Care – PPO | Admitting: Internal Medicine

## 2011-01-19 ENCOUNTER — Encounter: Payer: Self-pay | Admitting: Internal Medicine

## 2011-01-19 ENCOUNTER — Ambulatory Visit (INDEPENDENT_AMBULATORY_CARE_PROVIDER_SITE_OTHER): Payer: BC Managed Care – PPO | Admitting: Internal Medicine

## 2011-01-19 VITALS — BP 147/103 | HR 60 | Temp 98.4°F | Ht 73.0 in | Wt 332.0 lb

## 2011-01-19 DIAGNOSIS — IMO0002 Reserved for concepts with insufficient information to code with codable children: Secondary | ICD-10-CM

## 2011-01-19 DIAGNOSIS — F411 Generalized anxiety disorder: Secondary | ICD-10-CM

## 2011-01-19 DIAGNOSIS — I10 Essential (primary) hypertension: Secondary | ICD-10-CM

## 2011-01-19 NOTE — Progress Notes (Signed)
Subjective:    Patient ID: Juan [place], male    DOB: 02/07/1977, 35 y.o.   MRN: [id number]  HPI Still having trouble with back Had surgery again after last visit Still with right foot drop Now with pain down left leg again and is worse. Gets numbness  Has been fairly inactive Has gained weight  Doesn't check BP No headaches No chest pain or SOB No dizziness  Still stress with teenage daughter Sig premenstrual mood issues that she has little insight into  Current Outpatient Prescriptions on File Prior to Visit  Medication Sig Dispense Refill  . bisoprolol-hydrochlorothiazide (ZIAC) 5-6.25 MG per tablet Take 1 tablet by mouth daily.        . clonazePAM (KLONOPIN) 0.5 MG tablet Take 1 tablet (0.5 mg total) by mouth 2 (two) times daily as needed.  60 tablet  0  . diclofenac (VOLTAREN) 75 MG EC tablet Take 1 tablet (75 mg total) by mouth 2 (two) times daily as needed.  60 tablet  11  . fluticasone (FLONASE) 50 MCG/ACT nasal spray 2 sprays in each nostril daily       . triamterene-hydrochlorothiazide (MAXZIDE-25) 37.5-25 MG per tablet Take 1 tablet by mouth daily.  30 tablet  11    Allergies  Allergen Reactions  . Hydrocodone-Homatropine     REACTION: Itching. Has tolerated vicodin in the past  . Lisinopril     REACTION: cough  . Losartan Potassium     REACTION: cough---not positive though    Past Medical History  Diagnosis Date  . Anxiety   . GERD (gastroesophageal reflux disease)   . Hypertension   . Allergic rhinitis   . Obstructive sleep apnea     Past Surgical History  Procedure Date  . Lumbar disc surgery 5/10    Dr. Franky Macho  . Nasal sinus surgery 10/10    Dr. Willeen Cass  . Lumbar disc surgery 1/12    L4-5    Family History  Problem Relation Age of Onset  . Cancer Mother     Breast    History   Social History  . Marital Status: Divorced    Spouse Name: N/A    Number of Children: 1  . Years of Education: N/A   Occupational History  .  Mechanic, Pharmacologist     Now starting supervisory position   Social History Main Topics  . Smoking status: Current Everyday Smoker -- 1.0 packs/day for 17 years    Types: Cigarettes  . Smokeless tobacco: Never Used  . Alcohol Use: No     Quit after 3 DUI's.  Now drinks 6-8 at night when he doesn't have his daughter.  . Drug Use: Not on file  . Sexually Active: Not on file   Other Topics Concern  . Not on file   Social History Narrative   Now with full custody of daughter.   Review of Systems Sleeping fair. Not using CPAP. Not really rested but no abnormal daytime somnolence Bowel and bladder habits are normal    Objective:   Physical Exam  Constitutional: He appears well-developed and well-nourished. No distress.  Neck: Normal range of motion. Neck supple. No thyromegaly present.  Cardiovascular: Normal rate, regular rhythm and normal heart sounds.  Exam reveals no gallop.   No murmur heard. Pulmonary/Chest: Effort normal and breath sounds normal. No respiratory distress. He has no wheezes. He has no rales.  Lymphadenopathy:    He has no cervical adenopathy.  Neurological:  Decreased dorsiflexion of right foot but still has some ability to move  Psychiatric: He has a normal mood and affect. His behavior is normal. Judgment and thought content normal.       Frustrated but not depressed          Assessment & Plan:

## 2011-01-19 NOTE — Assessment & Plan Note (Signed)
Still with stress Doing okay though More frustrated with condition than anything else

## 2011-01-19 NOTE — Patient Instructions (Signed)
Please start [employer] on line

## 2011-01-19 NOTE — Assessment & Plan Note (Signed)
BP Readings from Last 3 Encounters:  01/19/11 147/103  07/09/10 129/84  04/12/10 152/108   Higher with the weight gain Discussed eating right for weight loss No change for now

## 2011-01-19 NOTE — Assessment & Plan Note (Signed)
Partial foot drop Discussed---no AFO for now On etodolac

## 2011-02-11 ENCOUNTER — Other Ambulatory Visit: Payer: Self-pay | Admitting: *Deleted

## 2011-02-11 MED ORDER — CLONAZEPAM 0.5 MG PO TABS: 0.5000 mg | ORAL_TABLET | Freq: Two times a day (BID) | ORAL | Status: DC | PRN

## 2011-02-11 NOTE — Telephone Encounter (Signed)
Okay #60 x 0 

## 2011-02-11 NOTE — Telephone Encounter (Signed)
rx called into pharmacy

## 2011-04-11 ENCOUNTER — Other Ambulatory Visit: Payer: Self-pay | Admitting: *Deleted

## 2011-04-11 NOTE — Telephone Encounter (Signed)
Okay #60 x 0 

## 2011-04-12 MED ORDER — CLONAZEPAM 0.5 MG PO TABS: 0.5000 mg | ORAL_TABLET | Freq: Two times a day (BID) | ORAL | Status: DC | PRN

## 2011-04-12 NOTE — Telephone Encounter (Signed)
rx called into pharmacy

## 2011-04-19 ENCOUNTER — Other Ambulatory Visit: Payer: Self-pay | Admitting: *Deleted

## 2011-04-19 MED ORDER — BISOPROLOL-HYDROCHLOROTHIAZIDE 5-6.25 MG PO TABS: 1.0000 | ORAL_TABLET | Freq: Every day | ORAL | Status: DC

## 2011-06-06 ENCOUNTER — Other Ambulatory Visit: Payer: Self-pay | Admitting: *Deleted

## 2011-06-06 MED ORDER — CLONAZEPAM 0.5 MG PO TABS: 0.5000 mg | ORAL_TABLET | Freq: Two times a day (BID) | ORAL | Status: DC | PRN

## 2011-06-06 NOTE — Telephone Encounter (Signed)
rx called into pharmacy

## 2011-08-02 ENCOUNTER — Encounter: Payer: Self-pay | Admitting: Internal Medicine

## 2011-08-02 ENCOUNTER — Ambulatory Visit (INDEPENDENT_AMBULATORY_CARE_PROVIDER_SITE_OTHER): Payer: BC Managed Care – PPO | Admitting: Internal Medicine

## 2011-08-02 VITALS — BP 140/90 | HR 72 | Temp 98.1°F | Wt 344.0 lb

## 2011-08-02 DIAGNOSIS — F411 Generalized anxiety disorder: Secondary | ICD-10-CM

## 2011-08-02 DIAGNOSIS — Z23 Encounter for immunization: Secondary | ICD-10-CM

## 2011-08-02 DIAGNOSIS — Z0000 Encounter for general adult medical examination without abnormal findings: Secondary | ICD-10-CM | POA: Insufficient documentation

## 2011-08-02 DIAGNOSIS — I10 Essential (primary) hypertension: Secondary | ICD-10-CM

## 2011-08-02 DIAGNOSIS — IMO0002 Reserved for concepts with insufficient information to code with codable children: Secondary | ICD-10-CM

## 2011-08-02 LAB — BASIC METABOLIC PANEL
BUN: 17 mg/dL (ref 6–23)
CO2: 26 mEq/L (ref 19–32)
Calcium: 8.9 mg/dL (ref 8.4–10.5)
Creatinine, Ser: 1 mg/dL (ref 0.4–1.5)
Glucose, Bld: 100 mg/dL — ABNORMAL HIGH (ref 70–99)

## 2011-08-02 LAB — HEPATIC FUNCTION PANEL
Alkaline Phosphatase: 84 U/L (ref 39–117)
Bilirubin, Direct: 0 mg/dL (ref 0.0–0.3)
Total Bilirubin: 0.3 mg/dL (ref 0.3–1.2)
Total Protein: 7.1 g/dL (ref 6.0–8.3)

## 2011-08-02 LAB — LIPID PANEL
Cholesterol: 182 mg/dL (ref 0–200)
Triglycerides: 310 mg/dL — ABNORMAL HIGH (ref 0.0–149.0)

## 2011-08-02 LAB — CBC WITH DIFFERENTIAL/PLATELET
Basophils Absolute: 0.1 10*3/uL (ref 0.0–0.1)
Eosinophils Absolute: 0.6 10*3/uL (ref 0.0–0.7)
Hemoglobin: 14.4 g/dL (ref 13.0–17.0)
Lymphocytes Relative: 29.5 % (ref 12.0–46.0)
MCHC: 34.5 g/dL (ref 30.0–36.0)
Neutro Abs: 4.4 10*3/uL (ref 1.4–7.7)
Neutrophils Relative %: 51.7 % (ref 43.0–77.0)
Platelets: 270 10*3/uL (ref 150.0–400.0)
RDW: 13.1 % (ref 11.5–14.6)

## 2011-08-02 LAB — TSH: TSH: 1.74 u[IU]/mL (ref 0.35–5.50)

## 2011-08-02 MED ORDER — BISOPROLOL-HYDROCHLOROTHIAZIDE 5-6.25 MG PO TABS: 1.0000 | ORAL_TABLET | Freq: Every day | ORAL | Status: DC

## 2011-08-02 MED ORDER — CLONAZEPAM 0.5 MG PO TABS: 0.5000 mg | ORAL_TABLET | Freq: Two times a day (BID) | ORAL | Status: DC | PRN

## 2011-08-02 MED ORDER — METHOCARBAMOL 750 MG PO TABS: 750.0000 mg | ORAL_TABLET | Freq: Every evening | ORAL | Status: AC | PRN

## 2011-08-02 MED ORDER — TRIAMTERENE-HCTZ 37.5-25 MG PO TABS: 1.0000 | ORAL_TABLET | Freq: Every day | ORAL | Status: DC

## 2011-08-02 NOTE — Assessment & Plan Note (Signed)
BP Readings from Last 3 Encounters:  08/02/11 140/90  01/19/11 147/103  07/09/10 129/84   Better today No changes in meds

## 2011-08-02 NOTE — Progress Notes (Signed)
Subjective:    Patient ID: Juan Franklin, male    DOB: 02/07/1977, 36 y.o.   MRN: [id number]  HPI Doing "so-so" Still in pain from back and leg Still some foot drop but he does okay with good shoes Went hunting this weekend and walked a lot---"paying for it ever since" Gets 60 vicodin from the orthopedist every month Stiff all the time  Walks all day long at work Has gained a fair amount of weight Drinks 6-12 beers most nights--hanging out with buddies Daughter has been with mom most of the time. Still a stress--in counseling, etc  Occ feelings of sadness--relates to stress with daughter and pain Not anhedonic Uses the clonazepam every morning before work. Has gone without it at times  Current Outpatient Prescriptions on File Prior to Visit  Medication Sig Dispense Refill  . clonazePAM (KLONOPIN) 0.5 MG tablet Take 1 tablet (0.5 mg total) by mouth 2 (two) times daily as needed.  60 tablet  0  . fluticasone (FLONASE) 50 MCG/ACT nasal spray 2 sprays in each nostril daily       . HYDROcodone-acetaminophen (VICODIN) 5-500 MG per tablet Take 1 tablet by mouth 2 (two) times daily as needed.        Marland Kitchen DISCONTD: bisoprolol-hydrochlorothiazide (ZIAC) 5-6.25 MG per tablet Take 1 tablet by mouth daily.  30 tablet  11  . DISCONTD: triamterene-hydrochlorothiazide (MAXZIDE-25) 37.5-25 MG per tablet Take 1 tablet by mouth daily.  30 tablet  11    Allergies  Allergen Reactions  . Hydrocodone-Homatropine     REACTION: Itching. Has tolerated vicodin in the past  . Lisinopril     REACTION: cough  . Losartan Potassium     REACTION: cough---not positive though    Past Medical History  Diagnosis Date  . Anxiety   . GERD (gastroesophageal reflux disease)   . Hypertension   . Allergic rhinitis   . Obstructive sleep apnea     Past Surgical History  Procedure Date  . Lumbar disc surgery 5/10    Dr. Franky Macho  . Nasal sinus surgery 10/10    Dr. Willeen Cass  . Lumbar disc surgery 1/12    L4-5     Family History  Problem Relation Age of Onset  . Cancer Mother     Breast    History   Social History  . Marital Status: Divorced    Spouse Name: N/A    Number of Children: 1  . Years of Education: N/A   Occupational History  . Mechanic, Pharmacologist     Now starting supervisory position   Social History Main Topics  . Smoking status: Current Everyday Smoker -- 1.0 packs/day for 17 years    Types: Cigarettes  . Smokeless tobacco: Never Used  . Alcohol Use: No     Quit after 3 DUI's.  Now drinks 6-8 at night when he doesn't have his daughter.  . Drug Use: Not on file  . Sexually Active: Not on file   Other Topics Concern  . Not on file   Social History Narrative   Now with full custody of daughter.   Review of Systems  Constitutional: Positive for fatigue and unexpected weight change.       Wears seat belt  HENT: Negative for hearing loss, congestion, rhinorrhea, dental problem and tinnitus.        Regular with dentist  Eyes: Negative for visual disturbance.       No diplopia or unilateral vision loss  Respiratory: Negative  for cough, chest tightness and shortness of breath.   Cardiovascular: Positive for leg swelling. Negative for chest pain and palpitations.       Some evening edema  Gastrointestinal: Negative for nausea, vomiting, constipation and blood in stool.       Occ heartburn---uses prilosec prn  Genitourinary: Negative for urgency, frequency and difficulty urinating.       No sexual problems  Musculoskeletal: Positive for arthralgias. Negative for joint swelling.  Skin: Negative for wound.       Chronic blisters between legs Some fluid if they bust Powder doesn't help  Neurological: Positive for weakness. Negative for dizziness, syncope, light-headedness, numbness and headaches.       Tingling in both feet at times Chronic right leg problems  Hematological: Negative for adenopathy. Does not bruise/bleed easily.  Psychiatric/Behavioral:  Positive for sleep disturbance and dysphoric mood. The patient is nervous/anxious.        Occ uses beer to get to sleep--counseled about this (doesn't drink after supper though)       Objective:   Physical Exam  Constitutional: He is oriented to person, place, and time. He appears well-developed and well-nourished. No distress.  HENT:  Head: Normocephalic and atraumatic.  Right Ear: External ear normal.  Left Ear: External ear normal.  Mouth/Throat: Oropharynx is clear and moist. No oropharyngeal exudate.  Eyes: Conjunctivae and EOM are normal. Pupils are equal, round, and reactive to light.  Neck: Normal range of motion. Neck supple. No thyromegaly present.  Cardiovascular: Normal rate, regular rhythm, normal heart sounds and intact distal pulses.  Exam reveals no gallop.   No murmur heard. Pulmonary/Chest: Effort normal and breath sounds normal. No respiratory distress. He has no wheezes. He has no rales.  Abdominal: Soft. There is no tenderness.  Musculoskeletal:       Trace ankle edema  Lymphadenopathy:    He has no cervical adenopathy.  Neurological: He is alert and oriented to person, place, and time.  Skin:       Non inflamed folliculitis--indurated but not inflamed  Psychiatric: His behavior is normal. Thought content normal.       Dysthymic but not overtly depressed          Assessment & Plan:

## 2011-08-02 NOTE — Assessment & Plan Note (Signed)
Diclofenac no help and caused stomach trouble Cyclobenzaprine helps but hung over feeling  Will try robaxin

## 2011-08-02 NOTE — Assessment & Plan Note (Signed)
Out of shape Drinking more than he should---discussed Unable to exercise much or sleep well Counseled on fitness, etc Tdap

## 2011-08-02 NOTE — Assessment & Plan Note (Signed)
Ongoing stress Asked him to try the clonazepam prn only

## 2011-08-04 ENCOUNTER — Encounter: Payer: Self-pay | Admitting: *Deleted

## 2011-09-21 ENCOUNTER — Other Ambulatory Visit: Payer: Self-pay | Admitting: *Deleted

## 2011-09-21 ENCOUNTER — Other Ambulatory Visit: Payer: Self-pay | Admitting: Neurosurgery

## 2011-09-21 DIAGNOSIS — M5126 Other intervertebral disc displacement, lumbar region: Secondary | ICD-10-CM

## 2011-09-21 MED ORDER — CLONAZEPAM 0.5 MG PO TABS: 0.5000 mg | ORAL_TABLET | Freq: Two times a day (BID) | ORAL | Status: DC | PRN

## 2011-09-21 NOTE — Telephone Encounter (Signed)
Medicine called to [facility]. 

## 2011-09-21 NOTE — Telephone Encounter (Signed)
[facility] PATIENT, ok to fill? 

## 2011-10-10 ENCOUNTER — Ambulatory Visit
Admission: RE | Admit: 2011-10-10 | Discharge: 2011-10-10 | Disposition: A | Discharge status: Home / Self Care | Payer: BC Managed Care – PPO | Source: Ambulatory Visit | Attending: Neurosurgery | Admitting: Neurosurgery

## 2011-10-10 DIAGNOSIS — M5126 Other intervertebral disc displacement, lumbar region: Secondary | ICD-10-CM

## 2011-10-10 MED ORDER — GADOBENATE DIMEGLUMINE 529 MG/ML IV SOLN
20.0000 mL | Freq: Once | INTRAVENOUS | Status: AC | PRN
  Administered 2011-10-10: 20 mL via INTRAVENOUS

## 2011-11-23 ENCOUNTER — Other Ambulatory Visit: Payer: Self-pay | Admitting: *Deleted

## 2011-11-23 MED ORDER — CLONAZEPAM 0.5 MG PO TABS: 0.5000 mg | ORAL_TABLET | Freq: Two times a day (BID) | ORAL | Status: DC | PRN

## 2011-11-23 NOTE — Telephone Encounter (Signed)
rx called into pharmacy

## 2011-11-23 NOTE — Telephone Encounter (Signed)
Okay #60 x 0 

## 2011-11-23 NOTE — Telephone Encounter (Signed)
Faxed refill request from [facility].  Last filled 09/21/11.

## 2012-01-12 ENCOUNTER — Other Ambulatory Visit: Payer: Self-pay | Admitting: *Deleted

## 2012-01-12 MED ORDER — CLONAZEPAM 0.5 MG PO TABS: 0.5000 mg | ORAL_TABLET | Freq: Two times a day (BID) | ORAL | Status: DC | PRN

## 2012-01-12 NOTE — Telephone Encounter (Signed)
Last refilled 11/23/11

## 2012-01-12 NOTE — Telephone Encounter (Signed)
rx called into pharmacy

## 2012-01-12 NOTE — Telephone Encounter (Signed)
Okay #60 x 0 

## 2012-03-06 ENCOUNTER — Other Ambulatory Visit: Payer: Self-pay | Admitting: *Deleted

## 2012-03-06 MED ORDER — CLONAZEPAM 0.5 MG PO TABS: 0.5000 mg | ORAL_TABLET | Freq: Two times a day (BID) | ORAL | Status: DC | PRN

## 2012-03-06 NOTE — Telephone Encounter (Signed)
Medication phoned to [facility] pharmacy as instructed with note attached to message pt needs to call for f/u appt.

## 2012-03-06 NOTE — Telephone Encounter (Signed)
Okay #60 x 0 He needs to schedule a follow up soon

## 2012-03-06 NOTE — Telephone Encounter (Signed)
Last filled 01/12/12

## 2012-04-26 ENCOUNTER — Other Ambulatory Visit: Payer: Self-pay | Admitting: *Deleted

## 2012-04-26 MED ORDER — CLONAZEPAM 0.5 MG PO TABS: 0.5000 mg | ORAL_TABLET | Freq: Two times a day (BID) | ORAL | Status: DC | PRN

## 2012-04-26 NOTE — Telephone Encounter (Signed)
Last filled 03/06/2012

## 2012-04-26 NOTE — Telephone Encounter (Signed)
rx called into pharmacy Left message on VM that pt needs to schedule CPX by the summer

## 2012-04-26 NOTE — Telephone Encounter (Signed)
Okay #60 x 0 He is due for PE anytime after May 7th Have him set up an appt by the end of the summer

## 2012-06-20 ENCOUNTER — Other Ambulatory Visit: Payer: Self-pay | Admitting: *Deleted

## 2012-06-20 NOTE — Telephone Encounter (Signed)
Last filled 04/26/12

## 2012-06-21 MED ORDER — CLONAZEPAM 0.5 MG PO TABS: 0.5000 mg | ORAL_TABLET | Freq: Two times a day (BID) | ORAL | Status: DC | PRN

## 2012-06-21 NOTE — Telephone Encounter (Signed)
rx called into pharmacy Left message on machine to call and schedule appt for physical

## 2012-06-21 NOTE — Telephone Encounter (Signed)
Okay #60 x 0 Please set up a physical after May but within the next 3-4 months or so

## 2012-08-09 ENCOUNTER — Ambulatory Visit (INDEPENDENT_AMBULATORY_CARE_PROVIDER_SITE_OTHER): Payer: BC Managed Care – PPO | Admitting: Internal Medicine

## 2012-08-09 ENCOUNTER — Encounter: Payer: Self-pay | Admitting: Internal Medicine

## 2012-08-09 VITALS — BP 132/92 | HR 63 | Temp 98.6°F | Ht 73.0 in | Wt 350.0 lb

## 2012-08-09 DIAGNOSIS — Z0000 Encounter for general adult medical examination without abnormal findings: Secondary | ICD-10-CM

## 2012-08-09 DIAGNOSIS — I10 Essential (primary) hypertension: Secondary | ICD-10-CM

## 2012-08-09 DIAGNOSIS — K219 Gastro-esophageal reflux disease without esophagitis: Secondary | ICD-10-CM

## 2012-08-09 DIAGNOSIS — F411 Generalized anxiety disorder: Secondary | ICD-10-CM

## 2012-08-09 NOTE — Assessment & Plan Note (Signed)
Uses the clonazepam at night --helps him sleep Trouble tolerating the CPAP
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
## 2012-08-09 NOTE — Patient Instructions (Signed)

## 2012-08-09 NOTE — Assessment & Plan Note (Signed)
BP Readings from Last 3 Encounters:  08/09/12 132/92  08/02/11 140/90  01/19/11 147/103   Reasonable control No changes for now Check labs

## 2012-08-09 NOTE — Assessment & Plan Note (Signed)
Diet info given Has to cut down on beers, do some exercise

## 2012-08-09 NOTE — Assessment & Plan Note (Signed)
Uses the prilosec occasionally

## 2012-08-09 NOTE — Assessment & Plan Note (Signed)
Needs to improve lifestyle but otherwise no concerns

## 2012-08-10 ENCOUNTER — Encounter: Payer: Self-pay | Admitting: *Deleted

## 2012-08-10 LAB — CBC WITH DIFFERENTIAL/PLATELET
Eosinophils Relative: 4 % (ref 0.0–5.0)
HCT: 44 % (ref 39.0–52.0)
Lymphs Abs: 3.5 10*3/uL (ref 0.7–4.0)
MCV: 91.4 fl (ref 78.0–100.0)
Monocytes Absolute: 1.2 10*3/uL — ABNORMAL HIGH (ref 0.1–1.0)
Platelets: 272 10*3/uL (ref 150.0–400.0)
WBC: 12.1 10*3/uL — ABNORMAL HIGH (ref 4.5–10.5)

## 2012-08-10 LAB — BASIC METABOLIC PANEL
BUN: 18 mg/dL (ref 6–23)
Chloride: 102 mEq/L (ref 96–112)
Glucose, Bld: 81 mg/dL (ref 70–99)
Potassium: 4.4 mEq/L (ref 3.5–5.1)

## 2012-08-10 LAB — LIPID PANEL
Cholesterol: 199 mg/dL (ref 0–200)
LDL Cholesterol: 128 mg/dL — ABNORMAL HIGH (ref 0–99)

## 2012-08-10 LAB — HEPATIC FUNCTION PANEL
ALT: 123 U/L — ABNORMAL HIGH (ref 0–53)
AST: 65 U/L — ABNORMAL HIGH (ref 0–37)
Albumin: 4.3 g/dL (ref 3.5–5.2)
Total Protein: 7.3 g/dL (ref 6.0–8.3)

## 2012-08-10 LAB — TSH: TSH: 0.72 u[IU]/mL (ref 0.35–5.50)

## 2012-08-17 ENCOUNTER — Other Ambulatory Visit: Payer: Self-pay

## 2012-08-17 MED ORDER — CLONAZEPAM 0.5 MG PO TABS: 0.5000 mg | ORAL_TABLET | Freq: Two times a day (BID) | ORAL | Status: DC | PRN

## 2012-08-17 MED ORDER — BISOPROLOL-HYDROCHLOROTHIAZIDE 5-6.25 MG PO TABS: 1.0000 | ORAL_TABLET | Freq: Every day | ORAL | Status: DC

## 2012-08-17 NOTE — Telephone Encounter (Signed)
Will refil in PCP's absence  Px written for call in

## 2012-08-17 NOTE — Telephone Encounter (Signed)
Rx called to pharmacy

## 2012-08-17 NOTE — Telephone Encounter (Signed)
[facility] faxed refill bisoprolol-hctz. Refill done.

## 2012-08-17 NOTE — Telephone Encounter (Signed)
[facility] faxed refill clonazepam. Last filled 06/21/12.Please advise. Dr Alphonsus Sias not in office.

## 2012-08-22 ENCOUNTER — Other Ambulatory Visit: Payer: Self-pay | Admitting: Internal Medicine

## 2012-08-22 MED ORDER — TRIAMTERENE-HCTZ 37.5-25 MG PO TABS: 1.0000 | ORAL_TABLET | Freq: Every day | ORAL | Status: DC

## 2012-10-03 ENCOUNTER — Other Ambulatory Visit: Payer: Self-pay | Admitting: Internal Medicine

## 2012-10-03 NOTE — Telephone Encounter (Signed)
rx called into pharmacy

## 2012-10-03 NOTE — Telephone Encounter (Signed)
Okay #60 x 0 

## 2012-10-29 ENCOUNTER — Other Ambulatory Visit: Payer: Self-pay | Admitting: Internal Medicine

## 2012-10-29 NOTE — Telephone Encounter (Signed)
Last filled 10/03/2012, [facility] PATIENT send back to me for call in

## 2012-10-29 NOTE — Telephone Encounter (Signed)
Call in, fill on/after 11/01/12. Thanks.

## 2012-10-30 NOTE — Telephone Encounter (Signed)
rx called into pharmacy

## 2013-01-11 ENCOUNTER — Other Ambulatory Visit: Payer: Self-pay | Admitting: Family Medicine

## 2013-01-11 NOTE — Telephone Encounter (Signed)
Electronic refill request.  Please advise. 

## 2013-01-12 NOTE — Telephone Encounter (Signed)
Okay #60 x 0 

## 2013-01-14 NOTE — Telephone Encounter (Signed)
rx called into pharmacy

## 2013-01-31 ENCOUNTER — Other Ambulatory Visit: Payer: Self-pay

## 2013-02-15 ENCOUNTER — Other Ambulatory Visit: Payer: Self-pay | Admitting: Internal Medicine

## 2013-03-15 ENCOUNTER — Other Ambulatory Visit: Payer: Self-pay | Admitting: Internal Medicine

## 2013-03-15 NOTE — Telephone Encounter (Signed)
Last filled 01/11/13

## 2013-03-15 NOTE — Telephone Encounter (Signed)
rx called into pharmacy

## 2013-03-15 NOTE — Telephone Encounter (Signed)
Okay #60 x 0 

## 2013-03-25 ENCOUNTER — Other Ambulatory Visit: Payer: Self-pay | Admitting: Internal Medicine

## 2013-04-17 ENCOUNTER — Encounter: Payer: Self-pay | Admitting: Radiology

## 2013-04-18 ENCOUNTER — Ambulatory Visit (INDEPENDENT_AMBULATORY_CARE_PROVIDER_SITE_OTHER): Payer: PRIVATE HEALTH INSURANCE | Admitting: Internal Medicine

## 2013-04-18 ENCOUNTER — Encounter: Payer: Self-pay | Admitting: Internal Medicine

## 2013-04-18 VITALS — BP 132/84 | HR 65 | Temp 98.6°F | Wt 351.8 lb

## 2013-04-18 DIAGNOSIS — R197 Diarrhea, unspecified: Secondary | ICD-10-CM

## 2013-04-18 DIAGNOSIS — R109 Unspecified abdominal pain: Secondary | ICD-10-CM

## 2013-04-18 NOTE — Progress Notes (Signed)
Pre-visit discussion using our clinic review tool. No additional management support is needed unless otherwise documented below in the visit note.  

## 2013-04-18 NOTE — Progress Notes (Signed)
Subjective:    Patient ID: Juan Franklin, male    DOB: June 02, 1975, 38 y.o.   MRN: [id number]  HPI  Pt presents to the clinic today with c/o abdominal cramping and diarrhea. This occurred 3 weeks ago and last for about 5 days. He was fine for a while but then his symptoms returned about 5 days ago. He feels bloated. He is having cramping all over his abdomen. He denies nausea or vomiting. He has tried Imodium without much relief. He has not had any changes in his diet. He has no history of diverticulosis. He denies fevers or blood in his stool. He does work in English as a second language teacher. He is not sure if he picked up something from that. He does feel like his symptoms are getting better.  Review of Systems      Past Medical History  Diagnosis Date  . Anxiety   . GERD (gastroesophageal reflux disease)   . Hypertension   . Allergic rhinitis   . Obstructive sleep apnea     Current Outpatient Prescriptions  Medication Sig Dispense Refill  . bisoprolol-hydrochlorothiazide (ZIAC) 5-6.25 MG per tablet TAKE 1 TABLET BY MOUTH DAILY  90 tablet  0  . clonazePAM (KLONOPIN) 0.5 MG tablet TAKE 1 TABLET BY MOUTH TWICE A DAY AS NEEDED  60 tablet  0  . HYDROcodone-acetaminophen (VICODIN) 5-500 MG per tablet Take 1 tablet by mouth 2 (two) times daily as needed.        . triamterene-hydrochlorothiazide (MAXZIDE-25) 37.5-25 MG per tablet TAKE 1 TABLET BY MOUTH ONCE A DAY  30 tablet  5   No current facility-administered medications for this visit.    Allergies  Allergen Reactions  . Hydrocodone-Homatropine     REACTION: Itching. Has tolerated vicodin in the past  . Lisinopril     REACTION: cough  . Losartan Potassium     REACTION: cough---not positive though    Family History  Problem Relation Age of Onset  . Cancer Mother     Breast    History   Social History  . Marital Status: Divorced    Spouse Name: N/A    Number of Children: 1  . Years of Education: N/A   Occupational History  .  Mechanic, Art therapist     Now starting supervisory position   Social History Main Topics  . Smoking status: Current Every Day Smoker -- 1.00 packs/day for 17 years    Types: Cigarettes  . Smokeless tobacco: Never Used  . Alcohol Use: No     Comment: Quit after 3 DUI's.  Now drinks 6-8 at night when he doesn't have his daughter.  . Drug Use: Not on file  . Sexual Activity: Not on file   Other Topics Concern  . Not on file   Social History Narrative   Now with full custody of daughter.     Constitutional: Denies fever, malaise, fatigue, headache or abrupt weight changes.  Gastrointestinal: Pt reports abdominal cramping and diarrhea. Denies abdominal pain, constipation, or blood in the stool.    No other specific complaints in a complete review of systems (except as listed in HPI above).  Objective:   Physical Exam   BP 132/84  Pulse 65  Temp(Src) 98.6 F (37 C) (Oral)  Wt 351 lb 12 oz (159.553 kg)  SpO2 97% Wt Readings from Last 3 Encounters:  04/18/13 351 lb 12 oz (159.553 kg)  08/09/12 350 lb (158.759 kg)  08/02/11 344 lb (156.037 kg)  General: Appears his stated age, obese and well developed, well nourished in NAD. Cardiovascular: Normal rate and rhythm. S1,S2 noted.  No murmur, rubs or gallops noted. No JVD or BLE edema. No carotid bruits noted. Pulmonary/Chest: Normal effort and positive vesicular breath sounds. No respiratory distress. No wheezes, rales or ronchi noted.  Abdomen: Soft and nontender. Normal bowel sounds, no bruits noted. No distention or masses noted. Liver, spleen and kidneys non palpable.  BMET    Component Value Date/Time   NA 139 08/09/2012 1613   K 4.4 08/09/2012 1613   CL 102 08/09/2012 1613   CO2 30 08/09/2012 1613   GLUCOSE 81 08/09/2012 1613   BUN 18 08/09/2012 1613   CREATININE 1.0 08/09/2012 1613   CALCIUM 9.3 08/09/2012 1613   GFRNONAA 91.22 07/20/2009 1132   GFRAA  Value: >60        The eGFR has been calculated using the MDRD  equation. This calculation has not been validated in all clinical situations. eGFR's persistently <60 mL/min signify possible Chronic Kidney Disease. 07/17/2008 1239    Lipid Panel     Component Value Date/Time   CHOL 199 08/09/2012 1613   TRIG 182.0* 08/09/2012 1613   HDL 35.10* 08/09/2012 1613   CHOLHDL 6 08/09/2012 1613   VLDL 36.4 08/09/2012 1613   LDLCALC 128* 08/09/2012 1613    CBC    Component Value Date/Time   WBC 12.1* 08/09/2012 1613   RBC 4.82 08/09/2012 1613   HGB 15.4 08/09/2012 1613   HCT 44.0 08/09/2012 1613   PLT 272.0 08/09/2012 1613   MCV 91.4 08/09/2012 1613   MCHC 35.0 08/09/2012 1613   RDW 12.9 08/09/2012 1613   LYMPHSABS 3.5 08/09/2012 1613   MONOABS 1.2* 08/09/2012 1613   EOSABS 0.5 08/09/2012 1613   BASOSABS 0.1 08/09/2012 1613    Hgb A1C No results found for this basename: HGBA1C        Assessment & Plan:   Diarrhea and abdominal cramping:  Unsure of etiology at this point ? 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

## 2013-04-18 NOTE — Patient Instructions (Signed)
Diet for Diarrhea, Adult  Frequent, runny stools (diarrhea) may be caused or worsened by food or drink. Diarrhea may be relieved by changing your diet. Since diarrhea can last up to 7 days, it is easy for you to lose too much fluid from the body and become dehydrated. Fluids that are lost need to be replaced. Along with a modified diet, make sure you drink enough fluids to keep your urine clear or pale yellow.  DIET INSTRUCTIONS  · Ensure adequate fluid intake (hydration): have 1 cup (8 oz) of fluid for each diarrhea episode. Avoid fluids that contain simple sugars or sports drinks, fruit juices, whole milk products, and sodas. Your urine should be clear or pale yellow if you are drinking enough fluids. Hydrate with an oral rehydration solution that you can purchase at pharmacies, retail stores, and online. You can prepare an oral rehydration solution at home by mixing the following ingredients together:  ·   tsp table salt.  · ¾ tsp baking soda.  ·  tsp salt substitute containing potassium chloride.  · 1  tablespoons sugar.  · 1 L (34 oz) of water.  · Certain foods and beverages may increase the speed at which food moves through the gastrointestinal (GI) tract. These foods and beverages should be avoided and include:  · Caffeinated and alcoholic beverages.  · High-fiber foods, such as raw fruits and vegetables, nuts, seeds, and whole grain breads and cereals.  · Foods and beverages sweetened with sugar alcohols, such as xylitol, sorbitol, and mannitol.  · Some foods may be well tolerated and may help thicken stool including:  · Starchy foods, such as rice, toast, pasta, low-sugar cereal, oatmeal, grits, baked potatoes, crackers, and bagels.    · Bananas.    · Applesauce.  · Add probiotic-rich foods to help increase healthy bacteria in the GI tract, such as yogurt and fermented milk products.  RECOMMENDED FOODS AND BEVERAGES  Starches  Choose foods with less than 2 g of fiber per serving.  · Recommended:  White,  French, and pita breads, plain rolls, buns, bagels. Plain muffins, matzo. Soda, saltine, or graham crackers. Pretzels, melba toast, zwieback. Cooked cereals made with water: cornmeal, farina, cream cereals. Dry cereals: refined corn, wheat, rice. Potatoes prepared any way without skins, refined macaroni, spaghetti, noodles, refined rice.  · Avoid:  Bread, rolls, or crackers made with whole wheat, multi-grains, rye, bran seeds, nuts, or coconut. Corn tortillas or taco shells. Cereals containing whole grains, multi-grains, bran, coconut, nuts, raisins. Cooked or dry oatmeal. Coarse wheat cereals, granola. Cereals advertised as "high-fiber." Potato skins. Whole grain pasta, wild or brown rice. Popcorn. Sweet potatoes, yams. Sweet rolls, doughnuts, waffles, pancakes, sweet breads.  Vegetables  · Recommended: Strained tomato and vegetable juices. Most well-cooked and canned vegetables without seeds. Fresh: Tender lettuce, cucumber without the skin, cabbage, spinach, bean sprouts.  · Avoid: Fresh, cooked, or canned: Artichokes, baked beans, beet greens, broccoli, Brussels sprouts, corn, kale, legumes, peas, sweet potatoes. Cooked: Green or red cabbage, spinach. Avoid large servings of any vegetables because vegetables shrink when cooked, and they contain more fiber per serving than fresh vegetables.  Fruit  · Recommended: Cooked or canned: Apricots, applesauce, cantaloupe, cherries, fruit cocktail, grapefruit, grapes, kiwi, mandarin oranges, peaches, pears, plums, watermelon. Fresh: Apples without skin, ripe banana, grapes, cantaloupe, cherries, grapefruit, peaches, oranges, plums. Keep servings limited to ½ cup or 1 piece.  · Avoid: Fresh: Apples with skin, apricots, mangoes, pears, raspberries, strawberries. Prune juice, stewed or dried prunes. Dried   fruits, raisins, dates. Large servings of all fresh fruits.  Protein  · Recommended: Ground or well-cooked tender beef, ham, veal, lamb, pork, or poultry. Eggs. Fish,  oysters, shrimp, lobster, other seafoods. Liver, organ meats.  · Avoid: Tough, fibrous meats with gristle. Peanut butter, smooth or chunky. Cheese, nuts, seeds, legumes, dried peas, beans, lentils.  Dairy  · Recommended: Yogurt, lactose-free milk, kefir, drinkable yogurt, buttermilk, soy milk, or plain hard cheese.  · Avoid: Milk, chocolate milk, beverages made with milk, such as milkshakes.  Soups  · Recommended: Bouillon, broth, or soups made from allowed foods. Any strained soup.  · Avoid: Soups made from vegetables that are not allowed, cream or milk-based soups.  Desserts and Sweets  · Recommended: Sugar-free gelatin, sugar-free frozen ice pops made without sugar alcohol.  · Avoid: Plain cakes and cookies, pie made with fruit, pudding, custard, cream pie. Gelatin, fruit, ice, sherbet, frozen ice pops. Ice cream, ice milk without nuts. Plain hard candy, honey, jelly, molasses, syrup, sugar, chocolate syrup, gumdrops, marshmallows.  Fats and Oils  · Recommended: Limit fats to less than 8 tsp per day.  · Avoid: Seeds, nuts, olives, avocados. Margarine, butter, cream, mayonnaise, salad oils, plain salad dressings. Plain gravy, crisp bacon without rind.  Beverages  · Recommended: Water, decaffeinated teas, oral rehydration solutions, sugar-free beverages not sweetened with sugar alcohols.  · Avoid: Fruit juices, caffeinated beverages (coffee, tea, soda), alcohol, sports drinks, or lemon-lime soda.  Condiments  · Recommended: Ketchup, mustard, horseradish, vinegar, cocoa powder. Spices in moderation: allspice, basil, bay leaves, celery powder or leaves, cinnamon, cumin powder, curry powder, ginger, mace, marjoram, onion or garlic powder, oregano, paprika, parsley flakes, ground pepper, rosemary, sage, savory, tarragon, thyme, turmeric.  · Avoid: Coconut, honey.  Document Released: 06/04/2003 Document Revised: 12/07/2011 Document Reviewed: 07/29/2011  ExitCare® Patient Information ©2014 [employer].

## 2013-04-19 ENCOUNTER — Telehealth: Payer: Self-pay | Admitting: Internal Medicine

## 2013-04-19 NOTE — Telephone Encounter (Signed)
Relevant patient education assigned to patient using Emmi. ° °

## 2013-05-01 ENCOUNTER — Other Ambulatory Visit: Payer: Self-pay | Admitting: Internal Medicine

## 2013-05-01 NOTE — Telephone Encounter (Signed)
Ok to phone in klonopin 

## 2013-05-01 NOTE — Telephone Encounter (Signed)
rx called into pharmacy

## 2013-05-01 NOTE — Telephone Encounter (Signed)
LETVAK PATIENT, Please send back to me for call in  

## 2013-05-29 ENCOUNTER — Other Ambulatory Visit: Payer: Self-pay | Admitting: Internal Medicine

## 2013-06-27 ENCOUNTER — Other Ambulatory Visit: Payer: Self-pay

## 2013-06-27 NOTE — Telephone Encounter (Signed)
Last filled 05/01/13--please advise

## 2013-06-28 NOTE — Telephone Encounter (Signed)
Okay #60 x 0 

## 2013-07-01 MED ORDER — CLONAZEPAM 0.5 MG PO TABS: 0.5000 mg | ORAL_TABLET | Freq: Two times a day (BID) | ORAL | Status: DC | PRN

## 2013-07-01 NOTE — Telephone Encounter (Signed)
rx called into pharmacy

## 2013-08-14 ENCOUNTER — Encounter: Payer: Self-pay | Admitting: Internal Medicine

## 2013-08-14 ENCOUNTER — Ambulatory Visit (INDEPENDENT_AMBULATORY_CARE_PROVIDER_SITE_OTHER): Payer: PRIVATE HEALTH INSURANCE | Admitting: Internal Medicine

## 2013-08-14 ENCOUNTER — Encounter: Payer: Self-pay | Admitting: *Deleted

## 2013-08-14 VITALS — BP 130/90 | HR 57 | Temp 97.7°F | Ht 73.0 in | Wt 344.0 lb

## 2013-08-14 DIAGNOSIS — R945 Abnormal results of liver function studies: Secondary | ICD-10-CM

## 2013-08-14 DIAGNOSIS — I10 Essential (primary) hypertension: Secondary | ICD-10-CM

## 2013-08-14 DIAGNOSIS — Z0000 Encounter for general adult medical examination without abnormal findings: Secondary | ICD-10-CM

## 2013-08-14 DIAGNOSIS — F411 Generalized anxiety disorder: Secondary | ICD-10-CM

## 2013-08-14 DIAGNOSIS — R7989 Other specified abnormal findings of blood chemistry: Secondary | ICD-10-CM

## 2013-08-14 DIAGNOSIS — G4733 Obstructive sleep apnea (adult) (pediatric): Secondary | ICD-10-CM

## 2013-08-14 DIAGNOSIS — K760 Fatty (change of) liver, not elsewhere classified: Secondary | ICD-10-CM | POA: Insufficient documentation

## 2013-08-14 LAB — COMPREHENSIVE METABOLIC PANEL
ALT: 97 U/L — ABNORMAL HIGH (ref 0–53)
AST: 57 U/L — ABNORMAL HIGH (ref 0–37)
Albumin: 4.1 g/dL (ref 3.5–5.2)
Alkaline Phosphatase: 81 U/L (ref 39–117)
BILIRUBIN TOTAL: 0.7 mg/dL (ref 0.2–1.2)
BUN: 15 mg/dL (ref 6–23)
CALCIUM: 9 mg/dL (ref 8.4–10.5)
CHLORIDE: 103 meq/L (ref 96–112)
CO2: 29 meq/L (ref 19–32)
Creatinine, Ser: 1 mg/dL (ref 0.4–1.5)
GFR: 92.3 mL/min (ref >60.00)
Glucose, Bld: 94 mg/dL (ref 70–99)
Potassium: 4.4 mEq/L (ref 3.5–5.1)
Sodium: 140 mEq/L (ref 135–145)
Total Protein: 6.9 g/dL (ref 6.0–8.3)

## 2013-08-14 LAB — CBC WITH DIFFERENTIAL/PLATELET
BASOS PCT: 0.5 % (ref 0.0–3.0)
Basophils Absolute: 0 10*3/uL (ref 0.0–0.1)
EOS PCT: 5.2 % — AB (ref 0.0–5.0)
Eosinophils Absolute: 0.5 10*3/uL (ref 0.0–0.7)
HEMATOCRIT: 42.9 % (ref 39.0–52.0)
HEMOGLOBIN: 14.6 g/dL (ref 13.0–17.0)
LYMPHS ABS: 3.3 10*3/uL (ref 0.7–4.0)
LYMPHS PCT: 32.8 % (ref 12.0–46.0)
MCHC: 34.1 g/dL (ref 30.0–36.0)
MCV: 93.4 fl (ref 78.0–100.0)
MONOS PCT: 7.7 % (ref 3.0–12.0)
Monocytes Absolute: 0.8 10*3/uL (ref 0.1–1.0)
NEUTROS ABS: 5.5 10*3/uL (ref 1.4–7.7)
Neutrophils Relative %: 53.8 % (ref 43.0–77.0)
Platelets: 258 10*3/uL (ref 150.0–400.0)
RBC: 4.59 Mil/uL (ref 4.22–5.81)
RDW: 13.1 % (ref 11.5–15.5)
WBC: 10.2 10*3/uL (ref 4.0–10.5)

## 2013-08-14 LAB — LIPID PANEL
CHOL/HDL RATIO: 5
Cholesterol: 174 mg/dL (ref 0–200)
HDL: 34.6 mg/dL — AB (ref >39.00)
LDL Cholesterol: 101 mg/dL — ABNORMAL HIGH (ref 0–99)
Triglycerides: 190 mg/dL — ABNORMAL HIGH (ref 0.0–149.0)
VLDL: 38 mg/dL (ref 0.0–40.0)

## 2013-08-14 LAB — TSH: TSH: 1.48 u[IU]/mL (ref 0.35–4.50)

## 2013-08-14 LAB — T4, FREE: Free T4: 0.85 ng/dL (ref 0.60–1.60)

## 2013-08-14 MED ORDER — BUPROPION HCL ER (SR) 150 MG PO TB12: 150.0000 mg | ORAL_TABLET | Freq: Two times a day (BID) | ORAL | Status: DC

## 2013-08-14 MED ORDER — KETOCONAZOLE 2 % EX CREA: 1.0000 "application " | TOPICAL_CREAM | Freq: Two times a day (BID) | CUTANEOUS | Status: DC

## 2013-08-14 NOTE — Assessment & Plan Note (Signed)
Discussed fitness No preventative care

## 2013-08-14 NOTE — Assessment & Plan Note (Signed)
BP Readings from Last 3 Encounters:  08/14/13 130/90  04/18/13 132/84  08/09/12 132/92   Good control No changes Due for labs

## 2013-08-14 NOTE — Patient Instructions (Addendum)
Please call 1-800 QUIT NOW for help with stopping smoking. Start the bupropion at 1 a day for 3 days, then go up to twice a day  Exercise to Lose Weight Exercise and a healthy diet may help you lose weight. Your doctor may suggest specific exercises. EXERCISE IDEAS AND TIPS  Choose low-cost things you enjoy doing, such as walking, bicycling, or exercising to workout videos.  Take stairs instead of the elevator.  Walk during your lunch break.  Park your car further away from work or school.  Go to a gym or an exercise class.  Start with 5 to 10 minutes of exercise each day. Build up to 30 minutes of exercise 4 to 6 days a week.  Wear shoes with good support and comfortable clothes.  Stretch before and after working out.  Work out until you breathe harder and your heart beats faster.  Drink extra water when you exercise.  Do not do so much that you hurt yourself, feel dizzy, or get very short of breath. Exercises that burn about 150 calories:  Running 1  miles in 15 minutes.  Playing volleyball for 45 to 60 minutes.  Washing and waxing a car for 45 to 60 minutes.  Playing touch football for 45 minutes.  Walking 1  miles in 35 minutes.  Pushing a stroller 1  miles in 30 minutes.  Playing basketball for 30 minutes.  Raking leaves for 30 minutes.  Bicycling 5 miles in 30 minutes.  Walking 2 miles in 30 minutes.  Dancing for 30 minutes.  Shoveling snow for 15 minutes.  Swimming laps for 20 minutes.  Walking up stairs for 15 minutes.  Bicycling 4 miles in 15 minutes.  Gardening for 30 to 45 minutes.  Jumping rope for 15 minutes.  Washing windows or floors for 45 to 60 minutes. Document Released: 04/16/2010 Document Revised: 06/06/2011 Document Reviewed: 04/16/2010 [facility] Patient Information 2014 [place], [place]. DASH Diet The DASH diet stands for "Dietary Approaches to Stop Hypertension." It is a healthy eating plan that has been shown to reduce high  blood pressure (hypertension) in as little as 14 days, while also possibly providing other significant health benefits. These other health benefits include reducing the risk of breast cancer after menopause and reducing the risk of type 2 diabetes, heart disease, colon cancer, and stroke. Health benefits also include weight loss and slowing kidney failure in patients with chronic kidney disease.  DIET GUIDELINES  Limit salt (sodium). Your diet should contain less than 1500 mg of sodium daily.  Limit refined or processed carbohydrates. Your diet should include mostly whole grains. Desserts and added sugars should be used sparingly.  Include small amounts of heart-healthy fats. These types of fats include nuts, oils, and tub margarine. Limit saturated and trans fats. These fats have been shown to be harmful in the body. CHOOSING FOODS  The following food groups are based on a 2000 calorie diet. See your Registered Dietitian for individual calorie needs. Grains and Grain Products (6 to 8 servings daily)  Eat More Often: Whole-wheat bread, brown rice, whole-grain or wheat pasta, quinoa, popcorn without added fat or salt (air popped).  Eat Less Often: White bread, white pasta, white rice, cornbread. Vegetables (4 to 5 servings daily)  Eat More Often: Fresh, frozen, and canned vegetables. Vegetables may be raw, steamed, roasted, or grilled with a minimal amount of fat.  Eat Less Often/Avoid: Creamed or fried vegetables. Vegetables in a cheese sauce. Fruit (4 to 5 servings daily)  Eat More Often: All fresh, canned (in natural juice), or frozen fruits. Dried fruits without added sugar. One hundred percent fruit juice ( cup [237 mL] daily).  Eat Less Often: Dried fruits with added sugar. Canned fruit in light or heavy syrup. Foot LockerLean Meats, Fish, and Poultry (2 servings or less daily. One serving is 3 to 4 oz [85-114 g]).  Eat More Often: Ninety percent or leaner ground beef, tenderloin, sirloin.  Round cuts of beef, chicken breast, Malaw[place]urkey breast. All fish. Grill, bake, or broil your meat. Nothing should be fried.  Eat Less Often/Avoid: Fatty cuts of meat, Mal[place]key, or chicken leg, thigh, or wing. Fried cuts of meat or fish. Dairy (2 to 3 servings)  Eat More Often: Low-fat or fat-free milk, low-fat plain or light yogurt, reduced-fat or part-skim cheese.  Eat Less Often/Avoid: Milk (whole, 2%).Whole milk yogurt. Full-fat cheeses. Nuts, Seeds, and Legumes (4 to 5 servings per week)  Eat More Often: All without added salt.  Eat Less Often/Avoid: Salted nuts and seeds, canned beans with added salt. Fats and Sweets (limited)  Eat More Often: Vegetable oils, tub margarines without trans fats, sugar-free gelatin. Mayonnaise and salad dressings.  Eat Less Often/Avoid: Coconut oils, palm oils, butter, stick margarine, cream, half and half, cookies, candy, pie. FOR MORE INFORMATION The Dash Diet Eating Plan: www.dashdiet.org Document Released: 03/03/2011 Document Revised: 06/06/2011 Document Reviewed: 03/03/2011 [facility] Patient Information 2014 [place]ExitCare, [place]LL[place].

## 2013-08-14 NOTE — Assessment & Plan Note (Signed)
Having trouble with machine He will contact [facility]---probably would benefit from autotitrate new machine

## 2013-08-14 NOTE — Progress Notes (Signed)
Pre visit review using our clinic review tool, if applicable. No additional management support is needed unless otherwise documented below in the visit note. 

## 2013-08-14 NOTE — Assessment & Plan Note (Signed)
Probably from alcohol--really needs to cut back Hopefully will do better with the bupropion Check hepatitis profile

## 2013-08-14 NOTE — Assessment & Plan Note (Signed)
Ongoing issues Notes inattention and wonders about Rx for this Will try bupropion

## 2013-08-14 NOTE — Assessment & Plan Note (Signed)
Lifestyle info given 
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
## 2013-08-15 LAB — HEPATITIS PANEL, ACUTE
HCV Ab: NEGATIVE
HEP B S AG: NEGATIVE
Hep A IgM: NONREACTIVE
Hep B C IgM: NONREACTIVE

## 2013-08-26 ENCOUNTER — Other Ambulatory Visit: Payer: Self-pay | Admitting: Internal Medicine

## 2013-08-27 ENCOUNTER — Other Ambulatory Visit: Payer: Self-pay | Admitting: *Deleted

## 2013-08-27 NOTE — Telephone Encounter (Signed)
There is a concern about his drug screen that I will need to discuss in person with him before I can refill the clonazepam

## 2013-08-27 NOTE — Telephone Encounter (Signed)
Last OV 08/14/13, med last filled 07/01/13.

## 2013-08-27 NOTE — Telephone Encounter (Signed)
Left message on his machine that he needs to come for OV before any refills can be done.

## 2013-08-29 ENCOUNTER — Ambulatory Visit (INDEPENDENT_AMBULATORY_CARE_PROVIDER_SITE_OTHER): Payer: PRIVATE HEALTH INSURANCE | Admitting: Internal Medicine

## 2013-08-29 ENCOUNTER — Encounter: Payer: Self-pay | Admitting: Internal Medicine

## 2013-08-29 VITALS — BP 160/100 | HR 70 | Temp 98.4°F | Wt 340.0 lb

## 2013-08-29 DIAGNOSIS — F411 Generalized anxiety disorder: Secondary | ICD-10-CM

## 2013-08-29 MED ORDER — CLONAZEPAM 0.5 MG PO TABS: 0.5000 mg | ORAL_TABLET | Freq: Two times a day (BID) | ORAL | Status: DC | PRN

## 2013-08-29 NOTE — Progress Notes (Signed)
   Subjective:    Patient ID: Juan Franklin, male    DOB: 19-Jun-1975, 38 y.o.   MRN: [id number]  HPI Reviewed his drug screen He had oxycodone on his screen but no active prescription  He states he had left over from past surgery by Dr Franky Macho (~1.5 years ago0 Used that before this past drug screen  I reviewed [facility] site---can't go back that far I did check Dr Sueanne Margarita note and he did prescribe it most recently in 2012 (so he could have left over)  Couldn't tolerate the bupropion Bad dreams Sleep wasn't right---stopped it  Current Outpatient Prescriptions on File Prior to Visit  Medication Sig Dispense Refill  . bisoprolol-hydrochlorothiazide (ZIAC) 5-6.25 MG per tablet TAKE 1 TABLET BY MOUTH ONCE A DAY  90 tablet  3  . clonazePAM (KLONOPIN) 0.5 MG tablet Take 1 tablet (0.5 mg total) by mouth 2 (two) times daily as needed.  60 tablet  0  . ketoconazole (NIZORAL) 2 % cream Apply 1 application topically 2 (two) times daily.  30 g  1  . triamterene-hydrochlorothiazide (MAXZIDE-25) 37.5-25 MG per tablet TAKE 1 TABLET BY MOUTH ONCE A DAY  30 tablet  5   No current facility-administered medications on file prior to visit.    Allergies  Allergen Reactions  . Hydrocodone-Homatropine     REACTION: Itching. Has tolerated vicodin in the past  . Lisinopril     REACTION: cough  . Losartan Potassium     REACTION: cough---not positive though    Past Medical History  Diagnosis Date  . Anxiety   . GERD (gastroesophageal reflux disease)   . Hypertension   . Allergic rhinitis   . Obstructive sleep apnea     Past Surgical History  Procedure Laterality Date  . Lumbar disc surgery  5/10    Dr. Franky Macho  . Nasal sinus surgery  10/10    Dr. Willeen Cass  . Lumbar disc surgery  1/12    L4-5    Family History  Problem Relation Age of Onset  . Cancer Mother     Breast    History   Social History  . Marital Status: Divorced    Spouse Name: N/A    Number of Children: 1  . Years of  Education: N/A   Occupational History  . Mechanic, Music therapist   Social History Main Topics  . Smoking status: Current Every Day Smoker -- 1.00 packs/day for 17 years    Types: Cigarettes  . Smokeless tobacco: Never Used  . Alcohol Use: No     Comment: Quit after 3 DUI's.  Now drinks 6-8 at night when he doesn't have his daughter.  . Drug Use: Not on file  . Sexual Activity: Not on file   Other Topics Concern  . Not on file   Social History Narrative       Review of Systems     Objective:   Physical Exam        Assessment & Plan:

## 2013-08-29 NOTE — Progress Notes (Signed)
Pre visit review using our clinic review tool, if applicable. No additional management support is needed unless otherwise documented below in the visit note. 

## 2013-08-29 NOTE — Assessment & Plan Note (Addendum)
Okay to refill the clonazepam Is low risk as the oxycodone now has an explanation

## 2013-09-04 ENCOUNTER — Encounter: Payer: Self-pay | Admitting: Internal Medicine

## 2013-09-26 ENCOUNTER — Other Ambulatory Visit: Payer: Self-pay | Admitting: Internal Medicine

## 2013-10-14 ENCOUNTER — Other Ambulatory Visit: Payer: Self-pay | Admitting: Internal Medicine

## 2013-10-14 NOTE — Telephone Encounter (Signed)
Okay #60 x 0 

## 2013-10-14 NOTE — Telephone Encounter (Signed)
08/29/13 

## 2013-10-14 NOTE — Telephone Encounter (Signed)
rx called into pharmacy

## 2013-10-15 ENCOUNTER — Ambulatory Visit: Payer: PRIVATE HEALTH INSURANCE | Admitting: Internal Medicine

## 2013-12-06 ENCOUNTER — Other Ambulatory Visit: Payer: Self-pay | Admitting: Internal Medicine

## 2013-12-06 NOTE — Telephone Encounter (Signed)
10/14/13 

## 2013-12-06 NOTE — Telephone Encounter (Signed)
rx called into pharmacy

## 2013-12-06 NOTE — Telephone Encounter (Signed)
Okay #60 x 0 

## 2014-01-01 ENCOUNTER — Other Ambulatory Visit: Payer: Self-pay | Admitting: Internal Medicine

## 2014-01-01 NOTE — Telephone Encounter (Signed)
Okay #60 x 0 Should be changed to low risk on the UDS result

## 2014-01-01 NOTE — Telephone Encounter (Signed)
Electronic Rx request for Klonopin received. Patient's last office visit was 08/29/13 and medication last filled 12/06/13. Please advise.

## 2014-01-03 NOTE — Telephone Encounter (Signed)
Rx called in to pharmacy. 

## 2014-01-10 ENCOUNTER — Other Ambulatory Visit: Payer: Self-pay

## 2014-04-23 ENCOUNTER — Other Ambulatory Visit: Payer: Self-pay | Admitting: Internal Medicine

## 2014-04-24 NOTE — Telephone Encounter (Signed)
Next appt 08/27/2014

## 2014-04-24 NOTE — Telephone Encounter (Signed)
Approved: #60 x 0 

## 2014-06-30 ENCOUNTER — Other Ambulatory Visit: Payer: Self-pay | Admitting: Internal Medicine

## 2014-06-30 NOTE — Telephone Encounter (Signed)
Approved: #60 x 0 

## 2014-06-30 NOTE — Telephone Encounter (Signed)
rx called into pharmacy

## 2014-06-30 NOTE — Telephone Encounter (Signed)
04/24/2014 

## 2014-08-01 ENCOUNTER — Other Ambulatory Visit: Payer: Self-pay | Admitting: Internal Medicine

## 2014-08-04 NOTE — Telephone Encounter (Signed)
06/30/14 

## 2014-08-04 NOTE — Telephone Encounter (Signed)
Approved: #60 x 0 

## 2014-08-04 NOTE — Telephone Encounter (Signed)
rx called into pharmacy

## 2014-08-27 ENCOUNTER — Encounter: Payer: Self-pay | Admitting: Internal Medicine

## 2014-08-27 ENCOUNTER — Ambulatory Visit (INDEPENDENT_AMBULATORY_CARE_PROVIDER_SITE_OTHER): Payer: PRIVATE HEALTH INSURANCE | Admitting: Internal Medicine

## 2014-08-27 VITALS — BP 138/88 | HR 63 | Temp 97.4°F | Wt 360.0 lb

## 2014-08-27 DIAGNOSIS — F419 Anxiety disorder, unspecified: Secondary | ICD-10-CM

## 2014-08-27 DIAGNOSIS — Z0000 Encounter for general adult medical examination without abnormal findings: Secondary | ICD-10-CM

## 2014-08-27 DIAGNOSIS — K760 Fatty (change of) liver, not elsewhere classified: Secondary | ICD-10-CM | POA: Diagnosis not present

## 2014-08-27 DIAGNOSIS — I10 Essential (primary) hypertension: Secondary | ICD-10-CM | POA: Diagnosis not present

## 2014-08-27 LAB — CBC WITH DIFFERENTIAL/PLATELET
Basophils Absolute: 0.1 10*3/uL (ref 0.0–0.1)
Basophils Relative: 0.5 % (ref 0.0–3.0)
Eosinophils Absolute: 0.6 10*3/uL (ref 0.0–0.7)
Eosinophils Relative: 5.1 % — ABNORMAL HIGH (ref 0.0–5.0)
HEMATOCRIT: 46.6 % (ref 39.0–52.0)
Hemoglobin: 15.7 g/dL (ref 13.0–17.0)
LYMPHS PCT: 30.1 % (ref 12.0–46.0)
Lymphs Abs: 3.3 10*3/uL (ref 0.7–4.0)
MCHC: 33.6 g/dL (ref 30.0–36.0)
MCV: 93.3 fl (ref 78.0–100.0)
MONOS PCT: 9.6 % (ref 3.0–12.0)
Monocytes Absolute: 1.1 10*3/uL — ABNORMAL HIGH (ref 0.1–1.0)
Neutro Abs: 6 10*3/uL (ref 1.4–7.7)
Neutrophils Relative %: 54.7 % (ref 43.0–77.0)
PLATELETS: 281 10*3/uL (ref 150.0–400.0)
RBC: 4.99 Mil/uL (ref 4.22–5.81)
RDW: 13.1 % (ref 11.5–15.5)
WBC: 11 10*3/uL — ABNORMAL HIGH (ref 4.0–10.5)

## 2014-08-27 LAB — COMPREHENSIVE METABOLIC PANEL
ALK PHOS: 82 U/L (ref 39–117)
ALT: 113 U/L — AB (ref 0–53)
AST: 66 U/L — ABNORMAL HIGH (ref 0–37)
Albumin: 4.5 g/dL (ref 3.5–5.2)
BUN: 16 mg/dL (ref 6–23)
CALCIUM: 9.4 mg/dL (ref 8.4–10.5)
CO2: 30 mEq/L (ref 19–32)
Chloride: 100 mEq/L (ref 96–112)
Creatinine, Ser: 0.96 mg/dL (ref 0.40–1.50)
GFR: 92.9 mL/min (ref >60.00)
Glucose, Bld: 97 mg/dL (ref 70–99)
POTASSIUM: 4.9 meq/L (ref 3.5–5.1)
Sodium: 136 mEq/L (ref 135–145)
Total Bilirubin: 0.5 mg/dL (ref 0.2–1.2)
Total Protein: 7.5 g/dL (ref 6.0–8.3)

## 2014-08-27 LAB — T4, FREE: Free T4: 0.82 ng/dL (ref 0.60–1.60)

## 2014-08-27 NOTE — Progress Notes (Signed)
Pre visit review using our clinic review tool, if applicable. No additional management support is needed unless otherwise documented below in the visit note. 

## 2014-08-27 NOTE — Assessment & Plan Note (Signed)
Discussed weight loss, decreased alcohol Only tylenol is in the norco

## 2014-08-27 NOTE — Patient Instructions (Signed)
DASH Eating Plan °DASH stands for "Dietary Approaches to Stop Hypertension." The DASH eating plan is a healthy eating plan that has been shown to reduce high blood pressure (hypertension). Additional health benefits may include reducing the risk of type 2 diabetes mellitus, heart disease, and stroke. The DASH eating plan may also help with weight loss. °WHAT DO I NEED TO KNOW ABOUT THE DASH EATING PLAN? °For the DASH eating plan, you will follow these general guidelines: °· Choose foods with a percent daily value for sodium of less than 5% (as listed on the food label). °· Use salt-free seasonings or herbs instead of table salt or sea salt. °· Check with your health care provider or pharmacist before using salt substitutes. °· Eat lower-sodium products, often labeled as "lower sodium" or "no salt added." °· Eat fresh foods. °· Eat more vegetables, fruits, and low-fat dairy products. °· Choose whole grains. Look for the word "whole" as the first word in the ingredient list. °· Choose fish and skinless chicken or turkey more often than red meat. Limit fish, poultry, and meat to 6 oz (170 g) each day. °· Limit sweets, desserts, sugars, and sugary drinks. °· Choose heart-healthy fats. °· Limit cheese to 1 oz (28 g) per day. °· Eat more home-cooked food and less restaurant, buffet, and fast food. °· Limit fried foods. °· Cook foods using methods other than frying. °· Limit canned vegetables. If you do use them, rinse them well to decrease the sodium. °· When eating at a restaurant, ask that your food be prepared with less salt, or no salt if possible. °WHAT FOODS CAN I EAT? °Seek help from a dietitian for individual calorie needs. °Grains °Whole grain or whole wheat bread. Brown rice. Whole grain or whole wheat pasta. Quinoa, bulgur, and whole grain cereals. Low-sodium cereals. Corn or whole wheat flour tortillas. Whole grain cornbread. Whole grain crackers. Low-sodium crackers. °Vegetables °Fresh or frozen vegetables  (raw, steamed, roasted, or grilled). Low-sodium or reduced-sodium tomato and vegetable juices. Low-sodium or reduced-sodium tomato sauce and paste. Low-sodium or reduced-sodium canned vegetables.  °Fruits °All fresh, canned (in natural juice), or frozen fruits. °Meat and Other Protein Products °Ground beef (85% or leaner), grass-fed beef, or beef trimmed of fat. Skinless chicken or turkey. Ground chicken or turkey. Pork trimmed of fat. All fish and seafood. Eggs. Dried beans, peas, or lentils. Unsalted nuts and seeds. Unsalted canned beans. °Dairy °Low-fat dairy products, such as skim or 1% milk, 2% or reduced-fat cheeses, low-fat ricotta or cottage cheese, or plain low-fat yogurt. Low-sodium or reduced-sodium cheeses. °Fats and Oils °Tub margarines without trans fats. Light or reduced-fat mayonnaise and salad dressings (reduced sodium). Avocado. Safflower, olive, or canola oils. Natural peanut or almond butter. °Other °Unsalted popcorn and pretzels. °The items listed above may not be a complete list of recommended foods or beverages. Contact your dietitian for more options. °WHAT FOODS ARE NOT RECOMMENDED? °Grains °White bread. White pasta. White rice. Refined cornbread. Bagels and croissants. Crackers that contain trans fat. °Vegetables °Creamed or fried vegetables. Vegetables in a cheese sauce. Regular canned vegetables. Regular canned tomato sauce and paste. Regular tomato and vegetable juices. °Fruits °Dried fruits. Canned fruit in light or heavy syrup. Fruit juice. °Meat and Other Protein Products °Fatty cuts of meat. Ribs, chicken wings, bacon, sausage, bologna, salami, chitterlings, fatback, hot dogs, bratwurst, and packaged luncheon meats. Salted nuts and seeds. Canned beans with salt. °Dairy °Whole or 2% milk, cream, half-and-half, and cream cheese. Whole-fat or sweetened yogurt. Full-fat   cheeses or blue cheese. Nondairy creamers and whipped toppings. Processed cheese, cheese spreads, or cheese  curds. °Condiments °Onion and garlic salt, seasoned salt, table salt, and sea salt. Canned and packaged gravies. Worcestershire sauce. Tartar sauce. Barbecue sauce. Teriyaki sauce. Soy sauce, including reduced sodium. Steak sauce. Fish sauce. Oyster sauce. Cocktail sauce. Horseradish. Ketchup and mustard. Meat flavorings and tenderizers. Bouillon cubes. Hot sauce. Tabasco sauce. Marinades. Taco seasonings. Relishes. °Fats and Oils °Butter, stick margarine, lard, shortening, ghee, and bacon fat. Coconut, palm kernel, or palm oils. Regular salad dressings. °Other °Pickles and olives. Salted popcorn and pretzels. °The items listed above may not be a complete list of foods and beverages to avoid. Contact your dietitian for more information. °WHERE CAN I FIND MORE INFORMATION? °National [employer], [employer], [employer] Institute: www.nhlbi.nih.gov/health/health-topics/topics/dash/ °Document Released: 03/03/2011 Document Revised: 07/29/2013 Document Reviewed: 01/16/2013 °ExitCare® Patient Information ©2015 [employer]. This information is not intended to replace advice given to you by your health care provider. Make sure you discuss any questions you have with your health care provider. ° °

## 2014-08-27 NOTE — Assessment & Plan Note (Signed)
Unwilling to change habits Probable cause of his heat intolerance

## 2014-08-27 NOTE — Assessment & Plan Note (Signed)
Healthy but terrible in fitness, etc Doesn't seem willing to make changes---just excuses Unwilling to stop smoking

## 2014-08-27 NOTE — Assessment & Plan Note (Signed)
Controlled reasonably with the clonazepam
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
## 2014-08-27 NOTE — Assessment & Plan Note (Signed)
BP Readings from Last 3 Encounters:  08/27/14 138/88  08/29/13 160/100  08/14/13 130/90   Acceptable control Check labs

## 2014-09-05 ENCOUNTER — Other Ambulatory Visit: Payer: Self-pay | Admitting: Internal Medicine

## 2014-09-24 ENCOUNTER — Other Ambulatory Visit: Payer: Self-pay | Admitting: Internal Medicine

## 2014-09-24 NOTE — Telephone Encounter (Signed)
08/04/14 

## 2014-09-25 NOTE — Telephone Encounter (Signed)
rx called into pharmacy

## 2014-09-25 NOTE — Telephone Encounter (Signed)
Approved:okay #[phone] x [phone] 

## 2014-10-08 ENCOUNTER — Other Ambulatory Visit: Payer: Self-pay | Admitting: Internal Medicine

## 2014-12-02 ENCOUNTER — Ambulatory Visit (INDEPENDENT_AMBULATORY_CARE_PROVIDER_SITE_OTHER): Payer: PRIVATE HEALTH INSURANCE | Admitting: Internal Medicine

## 2014-12-02 ENCOUNTER — Encounter: Payer: Self-pay | Admitting: Internal Medicine

## 2014-12-02 VITALS — BP 138/80 | HR 62 | Temp 97.8°F | Wt 361.0 lb

## 2014-12-02 DIAGNOSIS — I10 Essential (primary) hypertension: Secondary | ICD-10-CM | POA: Diagnosis not present

## 2014-12-02 MED ORDER — TRIAMTERENE-HCTZ 37.5-25 MG PO TABS: 1.0000 | ORAL_TABLET | Freq: Every day | ORAL | Status: DC

## 2014-12-02 MED ORDER — BISOPROLOL-HYDROCHLOROTHIAZIDE 10-6.25 MG PO TABS: 1.0000 | ORAL_TABLET | Freq: Every day | ORAL | Status: DC

## 2014-12-02 NOTE — Progress Notes (Signed)
Pre visit review using our clinic review tool, if applicable. No additional management support is needed unless otherwise documented below in the visit note. 

## 2014-12-02 NOTE — Assessment & Plan Note (Signed)
BP Readings from Last 3 Encounters:  12/02/14 138/80  08/27/14 138/88  08/29/13 160/100   Repeat on right 142/96  Discussed pros/cons of increased Rx Likely has some pain related increase--but hard to tell overall control Will increase the bisoprolol as long as no side effects

## 2014-12-02 NOTE — Progress Notes (Signed)
Subjective:    Patient ID: Juan Franklin, male    DOB: Jan 27, 1976, 39 y.o.   MRN: [id number]  HPI Sent here to evaluate BP  Was at orthopedist today BP elevated 161/102---then went down to 145/97  Doesn't check BP himself Has felt okay other than a rough morning due to back pain Had to walk from parking space---then up 2 flights of stairs Working with PT friend--- had overdone it 2 days ago at work (12 hours with new tasks)  No headaches or chest pain Chronic DOE-- relates to smoking Still not ready to stop--but thinking about it No beer for 4 days--daughter called him out to some degree  Current Outpatient Prescriptions on File Prior to Visit  Medication Sig Dispense Refill  . bisoprolol-hydrochlorothiazide (ZIAC) 5-6.25 MG per tablet TAKE 1 TABLET BY MOUTH ONCE A DAY 90 tablet 3  . clonazePAM (KLONOPIN) 0.5 MG tablet TAKE 1 TABLET BY MOUTH TWICE A DAY AS NEEDED 60 tablet 0  . HYDROcodone-acetaminophen (NORCO/VICODIN) 5-325 MG per tablet Take 2-4 tablets by mouth daily.    Marland Kitchen ketoconazole (NIZORAL) 2 % cream Apply 1 application topically 2 (two) times daily. 30 g 1  . triamterene-hydrochlorothiazide (MAXZIDE-25) 37.5-25 MG per tablet TAKE 1 TABLET BY MOUTH ONCE A DAY 90 tablet 3   No current facility-administered medications on file prior to visit.    Allergies  Allergen Reactions  . Hydrocodone-Homatropine     REACTION: Itching. Has tolerated vicodin in the past  . Lisinopril     REACTION: cough  . Losartan Potassium     REACTION: cough---not positive though    Past Medical History  Diagnosis Date  . Anxiety   . GERD (gastroesophageal reflux disease)   . Hypertension   . Allergic rhinitis   . Obstructive sleep apnea     Past Surgical History  Procedure Laterality Date  . Lumbar disc surgery  5/10    Dr. Franky Macho  . Nasal sinus surgery  10/10    Dr. Willeen Cass  . Lumbar disc surgery  1/12    L4-5    Family History  Problem Relation Age of Onset  . Cancer  Mother     Breast    Social History   Social History  . Marital Status: Divorced    Spouse Name: N/A  . Number of Children: 1  . Years of Education: N/A   Occupational History  . Mechanic, Music therapist   Social History Main Topics  . Smoking status: Current Every Day Smoker -- 1.00 packs/day for 17 years    Types: Cigarettes  . Smokeless tobacco: Never Used  . Alcohol Use: No     Comment: Quit after 3 DUI's.  Now drinks 6-8 at night when he doesn't have his daughter.  . Drug Use: Not on file  . Sexual Activity: Not on file   Other Topics Concern  . Not on file   Social History Narrative       Review of Systems  Not sleeping well Usually doesn't take the clonazepam since he had been drinking most nights Appetite is okay     Objective:   Physical Exam  Constitutional: He appears well-developed and well-nourished. No distress.  Neck: Normal range of motion. Neck supple. No thyromegaly present.  Cardiovascular: Normal rate, regular rhythm and normal heart sounds.  Exam reveals no gallop.   No murmur heard. Pulmonary/Chest: Effort normal and breath sounds normal. No respiratory distress. He has no wheezes.  He has no rales.  Lymphadenopathy:    He has no cervical adenopathy.  Psychiatric: He has a normal mood and affect. His behavior is normal.          Assessment & Plan:

## 2014-12-16 ENCOUNTER — Telehealth: Payer: Self-pay

## 2014-12-16 NOTE — Telephone Encounter (Signed)
Spoke with patient and got his setting, per pt this company already charged his card over $100 or more for the supplies, pt states he only needs the tubing but will take all the supplies, per pt it's been over 10 years since he had a sleep study. Form on your desk

## 2014-12-16 NOTE — Telephone Encounter (Signed)
.  left message to have patient return my call.  

## 2014-12-16 NOTE — Telephone Encounter (Signed)
Yes we received form, not sure if he's ever had a cpap? If so when and where was his last sleep study, need to know his settings and who last prescribed CPAP.  Marland Kitchenleft message to have patient return my call.

## 2014-12-16 NOTE — Telephone Encounter (Signed)
Pt left v/m; pt requested c pap supplies from [email or web address] on 12/12/14; [email or web address] advised pt sent request for cpap supplies order to Dr Alphonsus Sias but has not received response. Pt wants to ck status of cpap supply request.

## 2014-12-16 NOTE — Telephone Encounter (Signed)
Pt returned call. Please call back at [phone]

## 2014-12-17 NOTE — Telephone Encounter (Signed)
I signed the form. Let him know buyer beware---he might want to look for a different company

## 2014-12-17 NOTE — Telephone Encounter (Signed)
Form faxed back and scanned 

## 2015-01-01 ENCOUNTER — Other Ambulatory Visit: Payer: Self-pay | Admitting: Internal Medicine

## 2015-01-01 NOTE — Telephone Encounter (Signed)
Approved:okay #[phone] x [phone] 

## 2015-01-01 NOTE — Telephone Encounter (Signed)
09/25/2014 

## 2015-01-02 NOTE — Telephone Encounter (Signed)
rx called into pharmacy

## 2015-02-16 ENCOUNTER — Other Ambulatory Visit: Payer: Self-pay | Admitting: Internal Medicine

## 2015-02-16 NOTE — Telephone Encounter (Signed)
01/02/2015 

## 2015-02-16 NOTE — Telephone Encounter (Signed)
Approved: #60 x 0 

## 2015-02-17 NOTE — Telephone Encounter (Signed)
rx called into pharmacy

## 2015-04-01 ENCOUNTER — Ambulatory Visit (INDEPENDENT_AMBULATORY_CARE_PROVIDER_SITE_OTHER): Payer: PRIVATE HEALTH INSURANCE | Admitting: Internal Medicine

## 2015-04-01 ENCOUNTER — Encounter: Payer: Self-pay | Admitting: Internal Medicine

## 2015-04-01 VITALS — BP 151/90 | HR 77 | Temp 98.8°F | Wt 366.0 lb

## 2015-04-01 DIAGNOSIS — J0101 Acute recurrent maxillary sinusitis: Secondary | ICD-10-CM | POA: Insufficient documentation

## 2015-04-01 MED ORDER — PREDNISONE 10 MG PO TABS: ORAL_TABLET | ORAL | Status: DC

## 2015-04-01 MED ORDER — AMOXICILLIN-POT CLAVULANATE 875-125 MG PO TABS: 1.0000 | ORAL_TABLET | Freq: Two times a day (BID) | ORAL | Status: DC

## 2015-04-01 NOTE — Progress Notes (Signed)
Subjective:    Patient ID: Juan Franklin, male    DOB: September 06, 1975, 40 y.o.   MRN: [id number]  HPI  40YO male presents for acute visit.  Cough, congestion - Last two months, persistent sinus congestion, pressure with headache. Taking over the counter Mucinex, Advil, Sudafed no improvement. Then last 2 days, notes more cough, dyspnea, wheezing. Chills all day yesterday. No fever. Sinus surgery in past, about 7 years ago, Dr. Willeen Cass.   Smokes about 2PPD.  Wt Readings from Last 3 Encounters:  04/01/15 366 lb (166.017 kg)  12/02/14 361 lb (163.749 kg)  08/27/14 360 lb (163.295 kg)   BP Readings from Last 3 Encounters:  04/01/15 151/90  12/02/14 138/80  08/27/14 138/88    Past Medical History  Diagnosis Date  . Anxiety   . GERD (gastroesophageal reflux disease)   . Hypertension   . Allergic rhinitis   . Obstructive sleep apnea    Family History  Problem Relation Age of Onset  . Cancer Mother     Breast   Past Surgical History  Procedure Laterality Date  . Lumbar disc surgery  5/10    Dr. Franky Macho  . Nasal sinus surgery  10/10    Dr. Willeen Cass  . Lumbar disc surgery  1/12    L4-5   Social History   Social History  . Marital Status: Divorced    Spouse Name: N/A  . Number of Children: 1  . Years of Education: N/A   Occupational History  . Mechanic, Music therapist   Social History Main Topics  . Smoking status: Current Every Day Smoker -- 1.00 packs/day for 17 years    Types: Cigarettes  . Smokeless tobacco: Never Used  . Alcohol Use: No     Comment: Quit after 3 DUI's.  Now drinks 6-8 at night when he doesn't have his daughter.  . Drug Use: None  . Sexual Activity: Not Asked   Other Topics Concern  . None   Social History Narrative        Review of Systems  Constitutional: Positive for chills and fatigue. Negative for fever and activity change.  HENT: Positive for congestion, postnasal drip, rhinorrhea and sinus pressure. Negative  for ear discharge, ear pain, hearing loss, nosebleeds, sneezing, sore throat, tinnitus, trouble swallowing and voice change.   Eyes: Negative for discharge, redness, itching and visual disturbance.  Respiratory: Positive for cough, shortness of breath and wheezing. Negative for chest tightness and stridor.   Cardiovascular: Negative for chest pain and leg swelling.  Musculoskeletal: Negative for myalgias, arthralgias, neck pain and neck stiffness.  Skin: Negative for color change and rash.  Neurological: Negative for dizziness, facial asymmetry and headaches.  Psychiatric/Behavioral: Negative for sleep disturbance.       Objective:    BP 151/90 mmHg  Pulse 77  Temp(Src) 98.8 F (37.1 C) (Oral)  Wt 366 lb (166.017 kg)  SpO2 95% Physical Exam  Constitutional: He is oriented to person, place, and time. He appears well-developed and well-nourished. No distress.  HENT:  Head: Normocephalic and atraumatic.  Right Ear: External ear normal.  Left Ear: External ear normal.  Nose: Mucosal edema present. Right sinus exhibits maxillary sinus tenderness. Left sinus exhibits maxillary sinus tenderness.  Mouth/Throat: Oropharynx is clear and moist. No oropharyngeal exudate.  Eyes: Conjunctivae and EOM are normal. Pupils are equal, round, and reactive to light. Right eye exhibits no discharge. Left eye exhibits no discharge. No scleral icterus.  Neck:  Normal range of motion. Neck supple. No tracheal deviation present. No thyromegaly present.  Cardiovascular: Normal rate, regular rhythm and normal heart sounds.  Exam reveals no gallop and no friction rub.   No murmur heard. Pulmonary/Chest: Effort normal. No accessory muscle usage. No tachypnea. No respiratory distress. He has decreased breath sounds (prolonged expiration). He has no wheezes. He has rhonchi (few scattered). He has no rales. He exhibits no tenderness.  Musculoskeletal: Normal range of motion. He exhibits no edema.  Lymphadenopathy:     He has no cervical adenopathy.  Neurological: He is alert and oriented to person, place, and time. No cranial nerve deficit. Coordination normal.  Skin: Skin is warm and dry. No rash noted. He is not diaphoretic. No erythema. No pallor.  Psychiatric: He has a normal mood and affect. His behavior is normal. Judgment and thought content normal.          Assessment & Plan:   Problem List Items Addressed This Visit      Unprioritized   Acute recurrent maxillary sinusitis - Primary    Symptoms most consistent with acute maxillary sinusitis. Will start Augmentin and Prednisone taper. He will use Hydrocodone for cough as needed. Follow up if worsening symptoms, fever, chills, worsening dyspnea. Discussed potential side effects of Augmentin, Prednisone and Hydrocodone.      Relevant Medications   amoxicillin-clavulanate (AUGMENTIN) 875-125 MG tablet   predniSONE (DELTASONE) 10 MG tablet       Return if symptoms worsen or fail to improve.

## 2015-04-01 NOTE — Progress Notes (Signed)
Pre visit review using our clinic review tool, if applicable. No additional management support is needed unless otherwise documented below in the visit note. 

## 2015-04-01 NOTE — Assessment & Plan Note (Signed)
Symptoms most consistent with acute maxillary sinusitis. Will start Augmentin and Prednisone taper. He will use Hydrocodone for cough as needed. Follow up if worsening symptoms, fever, chills, worsening dyspnea. Discussed potential side effects of Augmentin, Prednisone and Hydrocodone.

## 2015-04-01 NOTE — Patient Instructions (Signed)
Start Augmentin twice daily. Eat yogurt or take a probiotic while on this medication.  Start Prednisone taper.  Call immediately if any worsening symptoms, fever, chills.

## 2015-04-20 ENCOUNTER — Other Ambulatory Visit: Payer: Self-pay | Admitting: Internal Medicine

## 2015-04-20 NOTE — Telephone Encounter (Signed)
02/17/2015 

## 2015-04-20 NOTE — Telephone Encounter (Signed)
rx called into pharmacy

## 2015-04-20 NOTE — Telephone Encounter (Signed)
Approved: #60 x 0 

## 2015-04-29 ENCOUNTER — Ambulatory Visit (INDEPENDENT_AMBULATORY_CARE_PROVIDER_SITE_OTHER): Payer: PRIVATE HEALTH INSURANCE | Admitting: Family Medicine

## 2015-04-29 ENCOUNTER — Encounter: Payer: Self-pay | Admitting: Family Medicine

## 2015-04-29 VITALS — BP 128/84 | HR 69 | Temp 97.6°F | Wt 368.6 lb

## 2015-04-29 DIAGNOSIS — J309 Allergic rhinitis, unspecified: Secondary | ICD-10-CM | POA: Diagnosis not present

## 2015-04-29 MED ORDER — PREDNISONE 10 MG PO TABS: ORAL_TABLET | ORAL | Status: DC

## 2015-04-29 MED ORDER — MONTELUKAST SODIUM 10 MG PO TABS: 10.0000 mg | ORAL_TABLET | Freq: Every day | ORAL | Status: DC

## 2015-04-29 NOTE — Progress Notes (Signed)
Pre visit review using our clinic review tool, if applicable. No additional management support is needed unless otherwise documented below in the visit note. 

## 2015-04-29 NOTE — Progress Notes (Signed)
Patient ID: Juan Franklin, male   DOB: 1975-06-03, 40 y.o.   MRN: [id number]  Marikay Alar, MD Phone: [phone]  Juan Franklin is a 40 y.o. male who presents today for same-day visit.  Patient notes for the last week he has had nasal and sinus congestion. He is blowing yellow mucus out of his nose. Some ear fullness with this. Postnasal drip as well. Some rhinorrhea. Notes he was seen early in January for similar symptoms that he had for several months. He was placed on prednisone and Augmentin. He got better for 2 weeks had no symptoms. He denies fever and cough. No shortness of breath. He is not taking any medicines for this currently. Notes he has tried Claritin and Flonase in the past with no benefit. He had sinus surgery 7-8 years ago.  PMH: Smoker.   ROS see history of present illness  Objective  Physical Exam Filed Vitals:   04/29/15 1356  BP: 128/84  Pulse: 69  Temp: 97.6 F (36.4 C)    BP Readings from Last 3 Encounters:  04/29/15 128/84  04/01/15 151/90  12/02/14 138/80   Wt Readings from Last 3 Encounters:  04/29/15 368 lb 9.6 oz (167.196 kg)  04/01/15 366 lb (166.017 kg)  12/02/14 361 lb (163.749 kg)    Physical Exam  Constitutional: He is well-developed, well-nourished, and in no distress.  HENT:  Head: Normocephalic and atraumatic.  Right Ear: External ear normal.  Left Ear: External ear normal.  Mouth/Throat: Oropharynx is clear and moist. No oropharyngeal exudate.  Swollen nasal turbinates, normal TMs bilaterally  Eyes: Conjunctivae are normal. Pupils are equal, round, and reactive to light.  Neck: Neck supple.  Cardiovascular: Normal rate, regular rhythm and normal heart sounds.  Exam reveals no gallop and no friction rub.   No murmur heard. Pulmonary/Chest: Effort normal and breath sounds normal. No respiratory distress. He has no wheezes. He has no rales.  Lymphadenopathy:    He has no cervical adenopathy.  Neurological: He is alert. Gait  normal.  Skin: Skin is warm and dry. He is not diaphoretic.     Assessment/Plan: Please see individual problem list.  Allergic rhinitis Symptoms most likely related to allergic rhinitis. Patient is well-appearing and afebrile. He notes he feels well. Doubt bacterial sinus infection given well appearance and being afebrile. He has never been on Singulair previously thus we will give him a trial of this. We will also treat him with a prednisone taper. He will monitor and let us know if he is not improved in the next week. He will also arrange for follow-up with his ENT physician. He is given return precautions.    Meds ordered this encounter  Medications  . predniSONE (DELTASONE) 10 MG tablet    Sig: Take  by mouth on day 1, then taper by  daily until gone    Dispense:  21 tablet    Refill:  0  . montelukast (SINGULAIR) 10 MG tablet    Sig: Take 1 tablet (10 mg total) by mouth at bedtime.    Dispense:  30 tablet    Refill:  3    Marikay Alar

## 2015-04-29 NOTE — Patient Instructions (Signed)
Nice to meet you. Your symptoms are likely related allergies. We will treat you with a prednisone taper and Singulair. Please contact her ear nose and throat physician and schedule a follow-up appointment. If you develop fevers please let us know. If you develop chest pain, shortness of breath, cough productive of blood, or any new or changing symptoms please seek medical attention.

## 2015-04-29 NOTE — Assessment & Plan Note (Addendum)
Symptoms most likely related to allergic rhinitis. Patient is well-appearing and afebrile. He notes he feels well. Doubt bacterial sinus infection given well appearance and being afebrile. He has never been on Singulair previously thus we will give him a trial of this. We will also treat him with a prednisone taper. He will monitor and let us know if he is not improved in the next week. He will also arrange for follow-up with his ENT physician. He is given return precautions.

## 2015-06-24 ENCOUNTER — Other Ambulatory Visit: Payer: Self-pay | Admitting: Internal Medicine

## 2015-06-24 NOTE — Telephone Encounter (Signed)
Last refill 04-20-15 #60/0. Last OV 12-02-14 Next OV 09-04-15

## 2015-06-24 NOTE — Telephone Encounter (Signed)
Left refill on voice mail at pharmacy  

## 2015-06-24 NOTE — Telephone Encounter (Signed)
Approved: #60 x 0 

## 2015-08-25 ENCOUNTER — Other Ambulatory Visit: Payer: Self-pay | Admitting: Internal Medicine

## 2015-08-25 NOTE — Telephone Encounter (Signed)
Last filled 06-24-15 #60 Last OV 08-27-14 Next OV 09-04-15

## 2015-08-26 NOTE — Telephone Encounter (Signed)
Approved: #60 x 0 

## 2015-08-26 NOTE — Telephone Encounter (Signed)
Left refill on voice mail at pharmacy  

## 2015-09-04 ENCOUNTER — Encounter: Payer: Self-pay | Admitting: Internal Medicine

## 2015-09-04 ENCOUNTER — Ambulatory Visit (INDEPENDENT_AMBULATORY_CARE_PROVIDER_SITE_OTHER): Payer: PRIVATE HEALTH INSURANCE | Admitting: Internal Medicine

## 2015-09-04 VITALS — BP 160/100 | HR 85 | Temp 98.0°F | Ht 72.5 in | Wt 372.0 lb

## 2015-09-04 DIAGNOSIS — Z0000 Encounter for general adult medical examination without abnormal findings: Secondary | ICD-10-CM

## 2015-09-04 DIAGNOSIS — I10 Essential (primary) hypertension: Secondary | ICD-10-CM

## 2015-09-04 LAB — CBC WITH DIFFERENTIAL/PLATELET
BASOS ABS: 0.1 10*3/uL (ref 0.0–0.1)
Basophils Relative: 0.5 % (ref 0.0–3.0)
Eosinophils Absolute: 0.6 10*3/uL (ref 0.0–0.7)
Eosinophils Relative: 5.4 % — ABNORMAL HIGH (ref 0.0–5.0)
HCT: 46.5 % (ref 39.0–52.0)
Hemoglobin: 15.8 g/dL (ref 13.0–17.0)
LYMPHS ABS: 2.8 10*3/uL (ref 0.7–4.0)
Lymphocytes Relative: 27 % (ref 12.0–46.0)
MCHC: 34 g/dL (ref 30.0–36.0)
MCV: 92.8 fl (ref 78.0–100.0)
MONOS PCT: 10.1 % (ref 3.0–12.0)
Monocytes Absolute: 1 10*3/uL (ref 0.1–1.0)
NEUTROS ABS: 5.9 10*3/uL (ref 1.4–7.7)
NEUTROS PCT: 57 % (ref 43.0–77.0)
PLATELETS: 279 10*3/uL (ref 150.0–400.0)
RBC: 5.01 Mil/uL (ref 4.22–5.81)
RDW: 13 % (ref 11.5–15.5)
WBC: 10.4 10*3/uL (ref 4.0–10.5)

## 2015-09-04 LAB — LIPID PANEL
CHOL/HDL RATIO: 5
CHOLESTEROL: 194 mg/dL (ref 0–200)
HDL: 36.1 mg/dL — ABNORMAL LOW (ref >39.00)
LDL Cholesterol: 121 mg/dL — ABNORMAL HIGH (ref 0–99)
NonHDL: 157.55
TRIGLYCERIDES: 185 mg/dL — AB (ref 0.0–149.0)
VLDL: 37 mg/dL (ref 0.0–40.0)

## 2015-09-04 LAB — COMPREHENSIVE METABOLIC PANEL
ALT: 184 U/L — AB (ref 0–53)
AST: 100 U/L — AB (ref 0–37)
Albumin: 4.3 g/dL (ref 3.5–5.2)
Alkaline Phosphatase: 113 U/L (ref 39–117)
BILIRUBIN TOTAL: 0.4 mg/dL (ref 0.2–1.2)
BUN: 15 mg/dL (ref 6–23)
CO2: 31 meq/L (ref 19–32)
CREATININE: 0.82 mg/dL (ref 0.40–1.50)
Calcium: 9.1 mg/dL (ref 8.4–10.5)
Chloride: 103 mEq/L (ref 96–112)
GFR: 110.84 mL/min (ref >60.00)
GLUCOSE: 81 mg/dL (ref 70–99)
Potassium: 4.4 mEq/L (ref 3.5–5.1)
SODIUM: 140 meq/L (ref 135–145)
Total Protein: 7.2 g/dL (ref 6.0–8.3)

## 2015-09-04 LAB — T4, FREE: Free T4: 0.88 ng/dL (ref 0.60–1.60)

## 2015-09-04 MED ORDER — AMLODIPINE BESYLATE 10 MG PO TABS: 10.0000 mg | ORAL_TABLET | Freq: Every day | ORAL | Status: DC

## 2015-09-04 NOTE — Patient Instructions (Signed)
Please start the amlodipine by cutting 4 tabs in half and taking 1/2 daily for 8 days. If no problems, then increase to a full tab daily. Let me know if you have any problems with this.

## 2015-09-04 NOTE — Progress Notes (Signed)
Pre visit review using our clinic review tool, if applicable. No additional management support is needed unless otherwise documented below in the visit note. 

## 2015-09-04 NOTE — Assessment & Plan Note (Signed)
No efforts for better health--as usual Needs to work less Trying to cut back on [place] and beer Discussed cigarettes, seat belts, exercise

## 2015-09-04 NOTE — Assessment & Plan Note (Signed)
BP Readings from Last 3 Encounters:  09/04/15 160/100  04/29/15 128/84  04/01/15 151/90   Intolerant of multiple meds Will try amlodipine

## 2015-09-04 NOTE — Progress Notes (Signed)
Subjective:    Patient ID: Juan Franklin, male    DOB: 1975-04-13, 40 y.o.   MRN: [id number]  HPI Here for physical  Stopped his blood pressure medications "I felt miserable" and he would get diaphoretic  Feels better off the medication He does have a sense of it running high--but didn't feel good on the meds ziac and HCTZ Didn't tolerate losartan/lisinopril in past---cough  No other concerns Some sciatica problems--using tilt table and it has really helped  Working 10-11 hours a day-- 7 days a week. No time for exercise Weight up a few pounds Trying to cut back on beer--and eating somewhat better  Current Outpatient Prescriptions on File Prior to Visit  Medication Sig Dispense Refill  . clonazePAM (KLONOPIN) 0.5 MG tablet TAKE 1 TABLET BY MOUTH TWICE A DAY 60 tablet 0  . HYDROcodone-acetaminophen (NORCO/VICODIN) 5-325 MG per tablet Take 2-4 tablets by mouth daily.    Marland Kitchen ketoconazole (NIZORAL) 2 % cream Apply 1 application topically 2 (two) times daily. 30 g 1  . montelukast (SINGULAIR) 10 MG tablet Take 1 tablet (10 mg total) by mouth at bedtime. 30 tablet 3   No current facility-administered medications on file prior to visit.    Allergies  Allergen Reactions  . Hydrocodone-Homatropine     REACTION: Itching. Has tolerated vicodin in the past  . Lisinopril     REACTION: cough  . Losartan Potassium     REACTION: cough---not positive though    Past Medical History  Diagnosis Date  . Anxiety   . GERD (gastroesophageal reflux disease)   . Hypertension   . Allergic rhinitis   . Obstructive sleep apnea     Past Surgical History  Procedure Laterality Date  . Lumbar disc surgery  5/10    Dr. Franky Macho  . Nasal sinus surgery  10/10    Dr. Willeen Cass  . Lumbar disc surgery  1/12    L4-5    Family History  Problem Relation Age of Onset  . Cancer Mother     Breast    Social History   Social History  . Marital Status: Divorced    Spouse Name: N/A  . Number  of Children: 1  . Years of Education: N/A   Occupational History  . Mechanic, Music therapist   Social History Main Topics  . Smoking status: Current Every Day Smoker -- 1.00 packs/day for 17 years    Types: Cigarettes  . Smokeless tobacco: Never Used  . Alcohol Use: No     Comment: Quit after 3 DUI's.  Now drinks 6-8 at night when he doesn't have his daughter.  . Drug Use: Not on file  . Sexual Activity: Not on file   Other Topics Concern  . Not on file   Social History Narrative       Review of Systems  Constitutional: Negative for fatigue.       Doesn't wear seat belt--discussed  HENT: Positive for tinnitus. Negative for dental problem, hearing loss and trouble swallowing.        Overdue for dentist  Eyes: Negative for visual disturbance.       No diplopia or unilateral vision loss  Respiratory: Positive for cough and wheezing. Negative for chest tightness and shortness of breath.        Not ready to stop smoking Coughs most days with some sputum  Cardiovascular: Positive for palpitations and leg swelling. Negative for chest pain.  Gastrointestinal: Negative for  abdominal pain, constipation and blood in stool.       Occ heartburn--- will take prilosec once a week or so  Endocrine: Negative for polydipsia and polyuria.  Genitourinary: Negative for urgency, frequency and difficulty urinating.  Musculoskeletal: Positive for back pain and arthralgias. Negative for joint swelling.  Skin: Negative for rash.       Same bumps in upper legs  Allergic/Immunologic: Positive for environmental allergies. Negative for immunocompromised state.       Chest congestion--not sure if allergies though  Neurological: Negative for dizziness, syncope, light-headedness and headaches.  Hematological: Negative for adenopathy. Does not bruise/bleed easily.  Psychiatric/Behavioral: Negative for dysphoric mood. The patient is nervous/anxious.        Using 1/2 clonazepam at night to  help sleep Takes 1 tab in Am --helps nerves       Objective:   Physical Exam  Constitutional: He is oriented to person, place, and time. He appears well-developed and well-nourished. No distress.  HENT:  Head: Normocephalic and atraumatic.  Right Ear: External ear normal.  Left Ear: External ear normal.  Mouth/Throat: Oropharynx is clear and moist. No oropharyngeal exudate.  Eyes: Conjunctivae are normal. Pupils are equal, round, and reactive to light.  Neck: Normal range of motion. Neck supple. No thyromegaly present.  Cardiovascular: Normal rate, regular rhythm, normal heart sounds and intact distal pulses.  Exam reveals no gallop.   No murmur heard. Pulmonary/Chest: Effort normal and breath sounds normal. No respiratory distress. He has no wheezes. He has no rales.  Abdominal: Soft. There is no tenderness.  Musculoskeletal: He exhibits no edema or tenderness.  Lymphadenopathy:    He has no cervical adenopathy.  Neurological: He is alert and oriented to person, place, and time.  Skin: No rash noted. No erythema.  Psychiatric: He has a normal mood and affect. His behavior is normal.          Assessment & Plan:

## 2015-09-04 NOTE — Addendum Note (Signed)
Addended by: Alvina ChouWALSH, TERRI J on: 09/04/2015 09:45 AM   Modules accepted: Kipp BroodSmartSet

## 2015-10-12 ENCOUNTER — Other Ambulatory Visit: Payer: Self-pay

## 2015-10-12 MED ORDER — CLONAZEPAM 0.5 MG PO TABS: 0.5000 mg | ORAL_TABLET | Freq: Two times a day (BID) | ORAL | Status: DC

## 2015-10-12 NOTE — Telephone Encounter (Signed)
Left refill on voice mail at pharmacy  

## 2015-10-12 NOTE — Telephone Encounter (Signed)
Approved: #60 x 0 

## 2015-10-12 NOTE — Telephone Encounter (Signed)
Last filled 08-26-15 #60 Last OV 09-04-15 Next OV 11-04-15

## 2015-10-20 ENCOUNTER — Telehealth: Payer: Self-pay

## 2015-10-20 NOTE — Telephone Encounter (Signed)
Pt left v/m; pt stopped Norvasc because ankles and legs were swelling. Pt wants to know if will call in substitute to [facility] until f/u appt on 11/04/15. Pt request cb. Dr Alphonsus Sias out of office and will send to provider in office.

## 2015-10-21 ENCOUNTER — Other Ambulatory Visit: Payer: Self-pay | Admitting: Internal Medicine

## 2015-10-21 MED ORDER — METOPROLOL SUCCINATE ER 25 MG PO TB24: 25.0000 mg | ORAL_TABLET | Freq: Every day | ORAL | 0 refills | Status: DC

## 2015-10-21 NOTE — Telephone Encounter (Signed)
Left message on voicemail.

## 2015-10-21 NOTE — Telephone Encounter (Signed)
Can you please call to see exactly what is reaction was with the Ziac and the HCTZ?

## 2015-10-21 NOTE — Telephone Encounter (Signed)
Ok, I want to try him on Metoprolol. He should follow up with Dr. Alphonsus Sias in 3 weeks

## 2015-10-21 NOTE — Telephone Encounter (Signed)
Pt reports he had excessive sweating with taking the medications, no other Sx--please advise

## 2015-10-22 NOTE — Telephone Encounter (Signed)
Left message on voicemail pty already has appt schedule for f/u x 2 weeks---does he need to reschedule?

## 2015-10-22 NOTE — Telephone Encounter (Signed)
No, 2 weeks is fine

## 2015-10-22 NOTE — Telephone Encounter (Signed)
Patient returned Melanie's call.  Please call patient back at [phone].

## 2015-10-23 NOTE — Telephone Encounter (Signed)
Left message on voicemail.

## 2015-11-04 ENCOUNTER — Ambulatory Visit (INDEPENDENT_AMBULATORY_CARE_PROVIDER_SITE_OTHER): Payer: PRIVATE HEALTH INSURANCE | Admitting: Internal Medicine

## 2015-11-04 ENCOUNTER — Encounter: Payer: Self-pay | Admitting: Internal Medicine

## 2015-11-04 VITALS — BP 130/82 | HR 56 | Temp 97.6°F | Wt 379.0 lb

## 2015-11-04 DIAGNOSIS — J309 Allergic rhinitis, unspecified: Secondary | ICD-10-CM | POA: Diagnosis not present

## 2015-11-04 DIAGNOSIS — K760 Fatty (change of) liver, not elsewhere classified: Secondary | ICD-10-CM | POA: Diagnosis not present

## 2015-11-04 DIAGNOSIS — I10 Essential (primary) hypertension: Secondary | ICD-10-CM

## 2015-11-04 MED ORDER — MONTELUKAST SODIUM 10 MG PO TABS: 10.0000 mg | ORAL_TABLET | Freq: Every day | ORAL | 3 refills | Status: DC

## 2015-11-04 NOTE — Progress Notes (Signed)
Pre visit review using our clinic review tool, if applicable. No additional management support is needed unless otherwise documented below in the visit note. 

## 2015-11-04 NOTE — Assessment & Plan Note (Signed)
LFTs higher May be component of alcohol related damage as well Hep profile negative 2 years ago Will check ultrasound to look for fibrosis

## 2015-11-04 NOTE — Assessment & Plan Note (Signed)
BP Readings from Last 3 Encounters:  11/04/15 130/82  09/04/15 (!) 160/100  04/29/15 128/84   Better today Will continue the metoprolol

## 2015-11-04 NOTE — Progress Notes (Signed)
   Subjective:    Patient ID: Juan CobbMichael K Steinert, male    DOB: 04/27/1975, 40 y.o.   MRN: [id number]  HPI Here for follow up of HTN  Trouble with swelling on the amlodipine Seems to be tolerating the toprol--started last week No dizziness No chest pain No SOB  Discussed his liver function Still 6-12 beers per night with his friends Hasn't cut back  Current Outpatient Prescriptions on File Prior to Visit  Medication Sig Dispense Refill  . clonazePAM (KLONOPIN) 0.5 MG tablet Take 1 tablet (0.5 mg total) by mouth 2 (two) times daily. 60 tablet 0  . HYDROcodone-acetaminophen (NORCO/VICODIN) 5-325 MG per tablet Take 2-4 tablets by mouth daily.    . metoprolol succinate (TOPROL-XL) 25 MG 24 hr tablet Take 1 tablet (25 mg total) by mouth daily. 30 tablet 0  . montelukast (SINGULAIR) 10 MG tablet Take 1 tablet (10 mg total) by mouth at bedtime. 30 tablet 3  . ketoconazole (NIZORAL) 2 % cream Apply 1 application topically 2 (two) times daily. (Patient not taking: Reported on 11/04/2015) 30 g 1   No current facility-administered medications on file prior to visit.     Allergies  Allergen Reactions  . Hydrocodone-Homatropine     REACTION: Itching. Has tolerated vicodin in the past  . Lisinopril     REACTION: cough  . Losartan Potassium     REACTION: cough---not positive though    Past Medical History:  Diagnosis Date  . Allergic rhinitis   . Anxiety   . GERD (gastroesophageal reflux disease)   . Hypertension   . Obstructive sleep apnea     Past Surgical History:  Procedure Laterality Date  . LUMBAR DISC SURGERY  5/10   Dr. Franky Machoabbell  . LUMBAR DISC SURGERY  1/12   L4-5  . NASAL SINUS SURGERY  10/10   Dr. Willeen CassBennett    Family History  Problem Relation Age of Onset  . Cancer Mother     Breast    Social History   Social History  . Marital status: Divorced    Spouse name: N/A  . Number of children: 1  . Years of education: N/A   Occupational History  . 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 Smoking status: Current Every Day Smoker    Packs/day: 1.00    Years: 17.00    Types: Cigarettes  . Smokeless tobacco: Never Used  . Alcohol use No     Comment: Quit after 3 DUI's.  Now drinks 6-8 at night when he doesn't have his daughter.  . Drug use: Unknown  . Sexual activity: Not on file   Other Topics Concern  . Not on file   Social History Narrative       Review of Systems Weight is up a few pounds No abnormal bleeding No itching    Objective:   Physical Exam  Constitutional: He appears well-developed. No distress.  Neck: Normal range of motion. Neck supple.  Abdominal: Soft. He exhibits no distension. There is no tenderness. There is no rebound and no guarding.  No liver enlargement obvious  Musculoskeletal: He exhibits no edema.  Lymphadenopathy:    He has no cervical adenopathy.          Assessment & Plan:

## 2015-11-10 ENCOUNTER — Ambulatory Visit
Admission: RE | Admit: 2015-11-10 | Discharge: 2015-11-10 | Disposition: A | Discharge status: Home / Self Care | Payer: PRIVATE HEALTH INSURANCE | Source: Ambulatory Visit | Attending: Internal Medicine | Admitting: Internal Medicine

## 2015-11-10 DIAGNOSIS — K760 Fatty (change of) liver, not elsewhere classified: Secondary | ICD-10-CM | POA: Diagnosis present

## 2015-12-03 ENCOUNTER — Other Ambulatory Visit: Payer: Self-pay | Admitting: Internal Medicine

## 2015-12-04 NOTE — Telephone Encounter (Signed)
Approved:refill for a year He should stay on it

## 2015-12-04 NOTE — Telephone Encounter (Signed)
Last filled 10/21/15---please advise if pt is to continue medication

## 2015-12-07 ENCOUNTER — Other Ambulatory Visit: Payer: Self-pay

## 2015-12-07 MED ORDER — CLONAZEPAM 0.5 MG PO TABS: 0.5000 mg | ORAL_TABLET | Freq: Two times a day (BID) | ORAL | 0 refills | Status: DC

## 2015-12-07 NOTE — Telephone Encounter (Signed)
Left refill on voice mail at pharmacy  

## 2015-12-07 NOTE — Telephone Encounter (Signed)
Approved: #60 x 0 

## 2015-12-07 NOTE — Telephone Encounter (Signed)
Last filled 10-12-15 #60 Last OV 11-04-15 No Future OV

## 2016-01-18 ENCOUNTER — Other Ambulatory Visit: Payer: Self-pay

## 2016-01-18 MED ORDER — CLONAZEPAM 0.5 MG PO TABS: 0.5000 mg | ORAL_TABLET | Freq: Two times a day (BID) | ORAL | 0 refills | Status: DC

## 2016-01-18 NOTE — Telephone Encounter (Signed)
Left refill on voice mail at pharmacy  

## 2016-01-18 NOTE — Telephone Encounter (Signed)
Last filled 12-07-15 #60 Last OV 11-04-15 No Future OV

## 2016-01-18 NOTE — Telephone Encounter (Signed)
Approved: #60 x 0 

## 2016-03-23 ENCOUNTER — Other Ambulatory Visit: Payer: Self-pay | Admitting: Internal Medicine

## 2016-03-23 NOTE — Telephone Encounter (Signed)
Left refill on voice mail at pharmacy  

## 2016-03-23 NOTE — Telephone Encounter (Signed)
Last filled 01-19-16 #60 Last OV 11-04-15 No Future OV

## 2016-03-23 NOTE — Telephone Encounter (Signed)
Approved: #60 x 0 

## 2016-05-02 ENCOUNTER — Other Ambulatory Visit: Payer: Self-pay | Admitting: Internal Medicine

## 2016-05-02 NOTE — Telephone Encounter (Signed)
Last filled 03-23-16#60. Last OV 11-04-15 No future ov

## 2016-05-02 NOTE — Telephone Encounter (Signed)
Approved: #60 x 0 

## 2016-05-02 NOTE — Telephone Encounter (Signed)
Left refill on voice mail at pharmacy  

## 2016-06-02 ENCOUNTER — Encounter: Payer: Self-pay | Admitting: Family Medicine

## 2016-06-02 ENCOUNTER — Other Ambulatory Visit: Payer: Self-pay | Admitting: Family Medicine

## 2016-06-02 ENCOUNTER — Telehealth: Payer: Self-pay | Admitting: Internal Medicine

## 2016-06-02 ENCOUNTER — Ambulatory Visit (INDEPENDENT_AMBULATORY_CARE_PROVIDER_SITE_OTHER): Payer: PRIVATE HEALTH INSURANCE | Admitting: Family Medicine

## 2016-06-02 VITALS — BP 173/117 | HR 74 | Temp 98.6°F | Wt 381.4 lb

## 2016-06-02 DIAGNOSIS — B356 Tinea cruris: Secondary | ICD-10-CM

## 2016-06-02 DIAGNOSIS — I10 Essential (primary) hypertension: Secondary | ICD-10-CM | POA: Diagnosis not present

## 2016-06-02 MED ORDER — KETOCONAZOLE 2 % EX CREA: 1.0000 "application " | TOPICAL_CREAM | Freq: Every day | CUTANEOUS | 0 refills | Status: DC

## 2016-06-02 MED ORDER — ITRACONAZOLE 200 MG PO TABS: 200.0000 mg | ORAL_TABLET | Freq: Every day | ORAL | 0 refills | Status: DC

## 2016-06-02 MED ORDER — FLUCONAZOLE 150 MG PO TABS: 150.0000 mg | ORAL_TABLET | ORAL | 0 refills | Status: DC

## 2016-06-02 NOTE — Telephone Encounter (Signed)
Chris from [facility] called and stated that they do not have Itraconazole 200 MG TABS in stock. Please advise, thank you!  Pharmacy - [facility] [place] - [address]

## 2016-06-02 NOTE — Progress Notes (Signed)
Subjective:  Patient ID: Juan Franklin, male    DOB: [birth year]  Age: 41 y.o. MRN: [id number]  CC: Jock itch  HPI:  41 year old male presents with the above complaint.  Patient reports that he's had a rash and is groin for the past week. Rashes located in the inguinal region as well as the perineal region. Slightly uncomfortable. He has been applying Lotrimin and ketoconazole cream with some improvement but no resolution. This is quite troublesome for him. Additionally, he feels like the medication is not working as well given the fact that he sweats quite a bit the medication seems to run off. No other associated symptoms. Seems to be exacerbated by moisture. He has had some improvement with treatment. No other complaints at this time.   Social Hx   Social History   Social History  . Marital status: Divorced    Spouse name: N/A  . Number of children: 1  . Years of education: N/A   Occupational History  . 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 Smoking status: Current Every Day Smoker    Packs/day: 1.00    Years: 17.00    Types: Cigarettes  . Smokeless tobacco: Never Used  . Alcohol use No     Comment: Quit after 3 DUI's.  Now drinks 6-8 at night when he doesn't have his daughter.  . Drug use: Unknown  . Sexual activity: Not Asked   Other Topics Concern  . None   Social History Narrative       Review of Systems  Constitutional: Negative.   Skin: Positive for rash.   Objective:  BP (!) 173/117 (BP Location: Left Arm, Cuff Size: Large)   Pulse 74   Temp 98.6 F (37 C) (Oral)   Wt (!) 381 lb 6 oz (173 kg)   SpO2 98%   BMI 51.01 kg/m   BP/Weight 06/02/2016 11/04/2015 09/04/2015  Systolic BP 173 130 160  Diastolic BP 117 82 100  Wt. (Lbs) 381.38 379 372  BMI 51.01 50.7 49.73   Physical Exam  Constitutional: He is oriented to person, place, and time. He appears well-developed. No distress.  Pulmonary/Chest: Effort normal.    Genitourinary:  Genitourinary Comments: Erythematous rash noted in the groin and on the scrotum. Maceration also noted in the groin.  Neurological: He is alert and oriented to person, place, and time.  Psychiatric: He has a normal mood and affect.  Vitals reviewed.   Lab Results  Component Value Date   WBC 10.4 09/04/2015   HGB 15.8 09/04/2015   HCT 46.5 09/04/2015   PLT 279.0 09/04/2015   GLUCOSE 81 09/04/2015   CHOL 194 09/04/2015   TRIG 185.0 (H) 09/04/2015   HDL 36.10 (L) 09/04/2015   LDLDIRECT 115.3 08/02/2011   LDLCALC 121 (H) 09/04/2015   ALT 184 (H) 09/04/2015   AST 100 (H) 09/04/2015   NA 140 09/04/2015   K 4.4 09/04/2015   CL 103 09/04/2015   CREATININE 0.82 09/04/2015   BUN 15 09/04/2015   CO2 31 09/04/2015   TSH 1.48 08/14/2013   MICROALBUR 2.8 (H) 03/16/2009    Assessment & Plan:   Problem List Items Addressed This Visit    Essential hypertension, benign    BP markedly elevated today. Patient asymptomatic. Needs close follow-up with PCP. Encouraged follow-up.      Tinea cruris - Primary    New problem. Continuing ketoconazole. Adding itraconazole.  Relevant Medications   ketoconazole (NIZORAL) 2 % cream   Itraconazole 200 MG TABS      Meds ordered this encounter  Medications  . ketoconazole (NIZORAL) 2 % cream    Sig: Apply 1 application topically daily.    Dispense:  60 g    Refill:  0  . Itraconazole 200 MG TABS    Sig: Take 200 mg by mouth daily. For 1 week.    Dispense:  7 tablet    Refill:  0    Follow-up: PRN  Everlene Other DO [facility]

## 2016-06-02 NOTE — Assessment & Plan Note (Signed)
BP markedly elevated today. Patient asymptomatic. Needs close follow-up with PCP. Encouraged follow-up.

## 2016-06-02 NOTE — Assessment & Plan Note (Signed)
New problem. Continuing ketoconazole. Adding itraconazole.

## 2016-06-02 NOTE — Patient Instructions (Signed)
Medications as prescribed.  Take care  Dr. Tamala Fothergillook   Jock Itch Jock itch (tinea cruris) is a fungal infection of the skin in the groin area. It is sometimes called ringworm, even though it is not caused by worms. It is caused by a fungus, which is a type of germ that thrives in dark, damp places. Jock itch causes a rash and itching in the groin and upper thigh area. It usually goes away in 2-3 weeks with treatment. What are the causes? The fungus that causes jock itch may be spread by:  Touching a fungus infection elsewhere on your body-such as athlete's foot-and then touching your groin area.  Sharing towels or clothing with an infected person. What increases the risk? Jock itch is most common in men and adolescent boys. This condition is more likely to develop from:  Being in hot, humid climates.  Wearing tight-fitting clothing or wet bathing suits for long periods of time.  Participating in sports.  Being overweight.  Having diabetes. What are the signs or symptoms? Symptoms of jock itch may include:  A red, pink, or brown rash in the groin area. The rash may spread to the thighs, anus, and buttocks.  Dry and scaly skin on or around the rash.  Itchiness. How is this diagnosed? Most often, a health care provider can make the diagnosis by looking at your rash. Sometimes, a scraping of the infected skin will be taken. This sample may be tested by looking at it under a microscope or by trying to grow the fungus from the sample (culture). How is this treated? Treatment for this condition may include:  Antifungal medicine to kill the fungus. This may be in various forms:  Skin cream or ointment.  Medicine taken by mouth.  Skin cream or ointment to reduce the itching.  Compresses or medicated powders to dry the infected skin. Follow these instructions at home:  Take medicines only as directed by your health care provider. Apply skin creams or ointments exactly as  directed.  Wear loose-fitting clothing.  Men should wear cotton boxer shorts.  Women should wear cotton underwear.  Change your underwear every day to keep your groin dry.  Avoid hot baths.  Dry your groin area well after bathing.  Use a separate towel to dry your groin area. This will help to prevent a spreading of the infection to other areas of your body.  Do not scratch the affected area.  Do not share towels with other people. Contact a health care provider if:  Your rash does not improve or it gets worse after 2 weeks of treatment.  Your rash is spreading.  Your rash returns after treatment is finished.  You have a fever.  You have redness, swelling, or pain in the area around your rash.  You have fluid, blood, or pus coming from your rash.  Your have your rash for more than 4 weeks. This information is not intended to replace advice given to you by your health care provider. Make sure you discuss any questions you have with your health care provider. Document Released: 03/04/2002 Document Revised: 08/20/2015 Document Reviewed: 12/24/2013 Elsevier Interactive Patient Education  2017 [employer]

## 2016-06-02 NOTE — Telephone Encounter (Signed)
Did not have Rx.  Will send in Diflucan.

## 2016-06-02 NOTE — Progress Notes (Signed)
Pre visit review using our clinic review tool, if applicable. No additional management support is needed unless otherwise documented below in the visit note. 

## 2016-06-20 ENCOUNTER — Telehealth: Payer: Self-pay | Admitting: Internal Medicine

## 2016-06-20 NOTE — Telephone Encounter (Signed)
Pt called and stated that he came in on 3/8 to see Dr. Adriana Simasook for jock itch. Pt states that 5 days after he takes the medication the symptoms come back. Please advise, thank you!  Call pt @ [phone]

## 2016-06-20 NOTE — Telephone Encounter (Signed)
Patient advised and verbalized understanding 

## 2016-06-20 NOTE — Telephone Encounter (Signed)
Needs to see [place].

## 2016-06-20 NOTE — Telephone Encounter (Signed)
Patient states jock itch cleared up with fluconazole tablet , for a few days then returned.  He didn't get much relief with ketoconazole cream    Last seen 06/02/16.   Please advise.

## 2016-06-22 ENCOUNTER — Ambulatory Visit (INDEPENDENT_AMBULATORY_CARE_PROVIDER_SITE_OTHER): Payer: PRIVATE HEALTH INSURANCE | Admitting: Internal Medicine

## 2016-06-22 ENCOUNTER — Encounter: Payer: Self-pay | Admitting: Internal Medicine

## 2016-06-22 VITALS — BP 138/90 | HR 68 | Temp 98.2°F | Wt 370.0 lb

## 2016-06-22 DIAGNOSIS — B356 Tinea cruris: Secondary | ICD-10-CM | POA: Diagnosis not present

## 2016-06-22 MED ORDER — FLUCONAZOLE 100 MG PO TABS: 100.0000 mg | ORAL_TABLET | Freq: Every day | ORAL | 3 refills | Status: DC

## 2016-06-22 MED ORDER — KETOCONAZOLE 2 % EX CREA: 1.0000 "application " | TOPICAL_CREAM | Freq: Every day | CUTANEOUS | 5 refills | Status: DC

## 2016-06-22 MED ORDER — CLONAZEPAM 0.5 MG PO TABS: ORAL_TABLET | ORAL | 0 refills | Status: DC

## 2016-06-22 NOTE — Assessment & Plan Note (Signed)
Partial response Will try weekly dose therapy with fluconazole Ketoconazole cream Try boxers?

## 2016-06-22 NOTE — Progress Notes (Signed)
Pre visit review using our clinic review tool, if applicable. No additional management support is needed unless otherwise documented below in the visit note. 

## 2016-06-22 NOTE — Progress Notes (Signed)
   Subjective:    Patient ID: Juan Franklin, male    DOB: 09-15-1975, 41 y.o.   MRN: [id number]  HPI Here due to ongoing rash in groin Reviewed recent visit  He was having swelling in the scrotum Brown drainage Took the itraconazole weekly---will wax and wane after the dose  Started about a month ago Tried OTC meds  Uses the cream and powder generally  Current Outpatient Prescriptions on File Prior to Visit  Medication Sig Dispense Refill  . clonazePAM (KLONOPIN) 0.5 MG tablet TAKE 1 TABLET BY MOUTH TWICE (2) DAILY AS NEEDED 60 tablet 0  . HYDROcodone-acetaminophen (NORCO/VICODIN) 5-325 MG per tablet Take 2-4 tablets by mouth daily.    . metoprolol succinate (TOPROL-XL) 25 MG 24 hr tablet TAKE 1 TABLET BY MOUTH ONCE A DAY 30 tablet 11  . montelukast (SINGULAIR) 10 MG tablet Take 1 tablet (10 mg total) by mouth at bedtime. 90 tablet 3   No current facility-administered medications on file prior to visit.     Allergies  Allergen Reactions  . Hydrocodone-Homatropine     REACTION: Itching. Has tolerated vicodin in the past  . Lisinopril     REACTION: cough  . Losartan Potassium     REACTION: cough---not positive though    Past Medical History:  Diagnosis Date  . Allergic rhinitis   . Anxiety   . GERD (gastroesophageal reflux disease)   . Hypertension   . Obstructive sleep apnea     Past Surgical History:  Procedure Laterality Date  . LUMBAR DISC SURGERY  5/10   Dr. Franky Machoabbell  . LUMBAR DISC SURGERY  1/12   L4-5  . NASAL SINUS SURGERY  10/10   Dr. Willeen CassBennett    Family History  Problem Relation Age of Onset  . Cancer Mother     Breast    Social History   Social History  . Marital status: Divorced    Spouse name: N/A  . Number of children: 1  . Years of education: N/A   Occupational History  . 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 Smoking status: Current Every Day Smoker    Packs/day: 1.00    Years: 17.00    Types:  Cigarettes  . Smokeless tobacco: Never Used  . Alcohol use No     Comment: Quit after 3 DUI's.  Now drinks 6-8 at night when he doesn't have his daughter.  . Drug use: Unknown  . Sexual activity: Not on file   Other Topics Concern  . Not on file   Social History Narrative       Review of Systems Wear's "Butt naked" underwear-- long briefs (no change) Started ATkin's diet in January--has lost some weight    Objective:   Physical Exam  Skin:  Mild crural and scrotal rash No ulcers          Assessment & Plan:

## 2016-06-30 ENCOUNTER — Telehealth: Payer: Self-pay

## 2016-06-30 MED ORDER — TRIAMCINOLONE ACETONIDE 0.1 % EX CREA: 1.0000 "application " | TOPICAL_CREAM | Freq: Two times a day (BID) | CUTANEOUS | 1 refills | Status: DC | PRN

## 2016-06-30 NOTE — Telephone Encounter (Signed)
Pt left v/m; pt seen 06/22/16 with jock itch and pt taking and using medication but no better. Pt wants to know if could try different med or what to do. Pt request cb. [facility].

## 2016-06-30 NOTE — Telephone Encounter (Signed)
Please let him know that I sent a prescription for a cortisone cream to reduce inflammation (if the pills got rid of the infection part) If his rash continues, the next step is to see a dermatologist

## 2016-06-30 NOTE — Telephone Encounter (Signed)
Spoke to pt. He is in agreement.

## 2016-09-07 ENCOUNTER — Ambulatory Visit (INDEPENDENT_AMBULATORY_CARE_PROVIDER_SITE_OTHER): Payer: PRIVATE HEALTH INSURANCE | Admitting: Internal Medicine

## 2016-09-07 ENCOUNTER — Encounter: Payer: Self-pay | Admitting: Internal Medicine

## 2016-09-07 VITALS — BP 136/80 | HR 67 | Temp 98.3°F | Ht <= 58 in | Wt 365.5 lb

## 2016-09-07 DIAGNOSIS — R7989 Other specified abnormal findings of blood chemistry: Secondary | ICD-10-CM | POA: Diagnosis not present

## 2016-09-07 DIAGNOSIS — K760 Fatty (change of) liver, not elsewhere classified: Secondary | ICD-10-CM

## 2016-09-07 DIAGNOSIS — F419 Anxiety disorder, unspecified: Secondary | ICD-10-CM

## 2016-09-07 DIAGNOSIS — I10 Essential (primary) hypertension: Secondary | ICD-10-CM

## 2016-09-07 DIAGNOSIS — Z0000 Encounter for general adult medical examination without abnormal findings: Secondary | ICD-10-CM

## 2016-09-07 LAB — COMPREHENSIVE METABOLIC PANEL
ALBUMIN: 4.3 g/dL (ref 3.5–5.2)
ALK PHOS: 119 U/L — AB (ref 39–117)
ALT: 48 U/L (ref 0–53)
AST: 31 U/L (ref 0–37)
BUN: 18 mg/dL (ref 6–23)
CALCIUM: 9.4 mg/dL (ref 8.4–10.5)
CO2: 26 mEq/L (ref 19–32)
CREATININE: 0.8 mg/dL (ref 0.40–1.50)
Chloride: 106 mEq/L (ref 96–112)
GFR: 113.46 mL/min (ref >60.00)
Glucose, Bld: 92 mg/dL (ref 70–99)
POTASSIUM: 4 meq/L (ref 3.5–5.1)
SODIUM: 139 meq/L (ref 135–145)
TOTAL PROTEIN: 7.1 g/dL (ref 6.0–8.3)
Total Bilirubin: 0.3 mg/dL (ref 0.2–1.2)

## 2016-09-07 LAB — LDL CHOLESTEROL, DIRECT: Direct LDL: 142 mg/dL

## 2016-09-07 LAB — CBC WITH DIFFERENTIAL/PLATELET
BASOS ABS: 0.1 10*3/uL (ref 0.0–0.1)
Basophils Relative: 0.9 % (ref 0.0–3.0)
EOS ABS: 0.3 10*3/uL (ref 0.0–0.7)
Eosinophils Relative: 3.1 % (ref 0.0–5.0)
HCT: 45.4 % (ref 39.0–52.0)
HEMOGLOBIN: 15.3 g/dL (ref 13.0–17.0)
LYMPHS PCT: 27.5 % (ref 12.0–46.0)
Lymphs Abs: 2.9 10*3/uL (ref 0.7–4.0)
MCHC: 33.7 g/dL (ref 30.0–36.0)
MCV: 93.6 fl (ref 78.0–100.0)
Monocytes Absolute: 1.1 10*3/uL — ABNORMAL HIGH (ref 0.1–1.0)
Monocytes Relative: 10.1 % (ref 3.0–12.0)
Neutro Abs: 6.2 10*3/uL (ref 1.4–7.7)
Neutrophils Relative %: 58.4 % (ref 43.0–77.0)
Platelets: 267 10*3/uL (ref 150.0–400.0)
RBC: 4.85 Mil/uL (ref 4.22–5.81)
RDW: 13.5 % (ref 11.5–15.5)
WBC: 10.6 10*3/uL — AB (ref 4.0–10.5)

## 2016-09-07 LAB — LIPID PANEL
CHOL/HDL RATIO: 6
CHOLESTEROL: 213 mg/dL — AB (ref 0–200)
HDL: 38.3 mg/dL — AB (ref >39.00)
NonHDL: 175.02
TRIGLYCERIDES: 202 mg/dL — AB (ref 0.0–149.0)
VLDL: 40.4 mg/dL — AB (ref 0.0–40.0)

## 2016-09-07 MED ORDER — METOPROLOL SUCCINATE ER 25 MG PO TB24: 25.0000 mg | ORAL_TABLET | Freq: Every day | ORAL | 3 refills | Status: DC

## 2016-09-07 MED ORDER — CLONAZEPAM 0.5 MG PO TABS: ORAL_TABLET | ORAL | 0 refills | Status: DC

## 2016-09-07 NOTE — Assessment & Plan Note (Signed)
Healthy but multiple issues Not ready to stop smoking Discussed fewer beers Healthy eating/exercise---not ready to change his lifestyle

## 2016-09-07 NOTE — Progress Notes (Signed)
Subjective:    Patient ID: Juan Franklin, male    DOB: 1975-10-06, 41 y.o.   MRN: [id number]  HPI Here for physical  Jock itch finally better with nystatin combo cream his friend had  Had back surgery a while back No longer taking the hydrocodone Had left over percocet also--- did take this a few days ago  Uses the clonazepam mostly in the morning This keeps his mood level Sleeps okay with melatonin  Takes the montelukast daily for allergies Needs refill  Tolerating metoprolol No side effects  Current Outpatient Prescriptions on File Prior to Visit  Medication Sig Dispense Refill  . clonazePAM (KLONOPIN) 0.5 MG tablet TAKE 1 TABLET BY MOUTH TWICE (2) DAILY AS NEEDED 60 tablet 0  . metoprolol succinate (TOPROL-XL) 25 MG 24 hr tablet TAKE 1 TABLET BY MOUTH ONCE A DAY 30 tablet 11  . montelukast (SINGULAIR) 10 MG tablet Take 1 tablet (10 mg total) by mouth at bedtime. 90 tablet 3   No current facility-administered medications on file prior to visit.     Allergies  Allergen Reactions  . Hydrocodone-Homatropine     REACTION: Itching. Has tolerated vicodin in the past  . Lisinopril     REACTION: cough  . Losartan Potassium     REACTION: cough---not positive though    Past Medical History:  Diagnosis Date  . Allergic rhinitis   . Anxiety   . GERD (gastroesophageal reflux disease)   . Hypertension   . Obstructive sleep apnea     Past Surgical History:  Procedure Laterality Date  . LUMBAR DISC SURGERY  5/10   Dr. Franky Macho  . LUMBAR DISC SURGERY  1/12   L4-5  . NASAL SINUS SURGERY  10/10   Dr. Willeen Cass    Family History  Problem Relation Age of Onset  . Cancer Mother        Breast    Social History   Social History  . Marital status: Divorced    Spouse name: N/A  . Number of children: 1  . Years of education: N/A   Occupational History  . Mechanic, Music therapist   Social History Main Topics  . Smoking status: Current Every Day  Smoker    Packs/day: 1.00    Years: 17.00    Types: Cigarettes  . Smokeless tobacco: Never Used  . Alcohol use No     Comment: Quit after 3 DUI's.  Now drinks 6-8 at night when he doesn't have his daughter.  . Drug use: Unknown  . Sexual activity: Not on file   Other Topics Concern  . Not on file   Social History Narrative       Review of Systems  Constitutional:       On [employer]'s diet Wears seat belt  HENT: Negative for hearing loss, tinnitus and trouble swallowing.        Keeps up with dentist  Eyes: Negative for visual disturbance.       No diplopia or unilateral vision loss  Respiratory: Positive for cough. Negative for chest tightness and shortness of breath.        Stable DOE  Cardiovascular: Positive for palpitations and leg swelling. Negative for chest pain.  Gastrointestinal: Negative for abdominal pain, blood in stool, constipation and nausea.       No heartburn  Endocrine: Negative for polydipsia and polyuria.  Genitourinary: Negative for difficulty urinating and testicular pain.  Musculoskeletal: Positive for back pain. Negative for  arthralgias and joint swelling.  Skin:       Jock itch is better  Allergic/Immunologic: Positive for environmental allergies. Negative for immunocompromised state.  Neurological: Negative for dizziness, syncope, light-headedness and headaches.  Hematological: Negative for adenopathy. Does not bruise/bleed easily.  Psychiatric/Behavioral: Positive for sleep disturbance. Negative for dysphoric mood. The patient is nervous/anxious.        Objective:   Physical Exam  Constitutional: He is oriented to person, place, and time. He appears well-developed and well-nourished. No distress.  HENT:  Head: Normocephalic and atraumatic.  Right Ear: External ear normal.  Left Ear: External ear normal.  Mouth/Throat: Oropharynx is clear and moist. No oropharyngeal exudate.  Eyes: Conjunctivae are normal. Pupils are equal, round, and reactive to  light.  Neck: Normal range of motion. Neck supple. No thyromegaly present.  Cardiovascular: Normal rate, regular rhythm, normal heart sounds and intact distal pulses.  Exam reveals no gallop.   No murmur heard. Pulmonary/Chest: Effort normal and breath sounds normal. No respiratory distress. He has no wheezes. He has no rales.  Abdominal: Soft. There is no tenderness.  Musculoskeletal: He exhibits no edema or tenderness.  Lymphadenopathy:    He has no cervical adenopathy.  Neurological: He is alert and oriented to person, place, and time.  Skin: No rash noted. No erythema.  Psychiatric: He has a normal mood and affect. His behavior is normal.          Assessment & Plan:

## 2016-09-07 NOTE — Assessment & Plan Note (Signed)
Does okay with the clonazepam 

## 2016-09-07 NOTE — Assessment & Plan Note (Signed)
Will recheck LFTs Discussed weight and the beer

## 2016-09-07 NOTE — Assessment & Plan Note (Signed)
BP Readings from Last 3 Encounters:  09/07/16 136/80  06/22/16 138/90  06/02/16 (!) 173/117   Better now Continue the metoprolol

## 2016-09-14 LAB — TOXASSURE SELECT 13 (MW), URINE

## 2016-11-11 ENCOUNTER — Other Ambulatory Visit: Payer: Self-pay | Admitting: Internal Medicine

## 2016-11-11 NOTE — Telephone Encounter (Signed)
Last filled 09-07-16 #60 Last OV 09-07-16 Next OV 09-08-17  Forward to Dr Ermalene Searing in Dr Karle Starch absence. Please send back to [facility] once approved or denied. Thanks

## 2016-11-11 NOTE — Telephone Encounter (Signed)
Left refill on voice mail at pharmacy  

## 2016-12-23 ENCOUNTER — Other Ambulatory Visit: Payer: Self-pay | Admitting: Internal Medicine

## 2016-12-23 NOTE — Telephone Encounter (Signed)
Approved: #60 x 0 

## 2016-12-23 NOTE — Telephone Encounter (Signed)
Last filled 11-11-16 #60 Last OV 09-07-16 Next OV 09-08-17

## 2016-12-23 NOTE — Telephone Encounter (Signed)
Left refill on voice mail at pharmacy  

## 2017-01-06 ENCOUNTER — Other Ambulatory Visit: Payer: Self-pay | Admitting: Internal Medicine

## 2017-01-06 DIAGNOSIS — J309 Allergic rhinitis, unspecified: Secondary | ICD-10-CM

## 2017-03-02 ENCOUNTER — Other Ambulatory Visit: Payer: Self-pay | Admitting: Internal Medicine

## 2017-03-02 NOTE — Telephone Encounter (Signed)
Left refill on voice mail at pharmacy  

## 2017-03-02 NOTE — Telephone Encounter (Signed)
Last filled 12-23-16 #60 Last OV 09-07-16 Next OV 09-08-17

## 2017-03-02 NOTE — Telephone Encounter (Signed)
Approved: #60 x 0 

## 2017-04-17 ENCOUNTER — Other Ambulatory Visit: Payer: Self-pay | Admitting: Neurosurgery

## 2017-04-17 DIAGNOSIS — M5126 Other intervertebral disc displacement, lumbar region: Secondary | ICD-10-CM

## 2017-04-19 ENCOUNTER — Ambulatory Visit
Admission: RE | Admit: 2017-04-19 | Discharge: 2017-04-19 | Disposition: A | Discharge status: Home / Self Care | Payer: PRIVATE HEALTH INSURANCE | Source: Ambulatory Visit | Attending: Neurosurgery | Admitting: Neurosurgery

## 2017-04-19 DIAGNOSIS — M5126 Other intervertebral disc displacement, lumbar region: Secondary | ICD-10-CM

## 2017-04-19 MED ORDER — GADOBENATE DIMEGLUMINE 529 MG/ML IV SOLN
20.0000 mL | Freq: Once | INTRAVENOUS | Status: AC | PRN
  Administered 2017-04-19: 20 mL via INTRAVENOUS

## 2017-04-20 ENCOUNTER — Encounter: Payer: Self-pay | Admitting: Internal Medicine

## 2017-04-20 ENCOUNTER — Ambulatory Visit (INDEPENDENT_AMBULATORY_CARE_PROVIDER_SITE_OTHER): Payer: PRIVATE HEALTH INSURANCE | Admitting: Internal Medicine

## 2017-04-20 VITALS — BP 178/94 | HR 73 | Temp 98.2°F | Wt 384.2 lb

## 2017-04-20 DIAGNOSIS — I10 Essential (primary) hypertension: Secondary | ICD-10-CM

## 2017-04-20 LAB — POC URINALSYSI DIPSTICK (AUTOMATED)
Bilirubin, UA: NEGATIVE
Blood, UA: NEGATIVE
Glucose, UA: NEGATIVE
Ketones, UA: NEGATIVE
LEUKOCYTES UA: NEGATIVE
NITRITE UA: NEGATIVE
PH UA: 7 (ref 5.0–8.0)
PROTEIN UA: NEGATIVE
Spec Grav, UA: 1.015 (ref 1.010–1.025)
UROBILINOGEN UA: 0.2 U/dL

## 2017-04-20 MED ORDER — CLONAZEPAM 0.5 MG PO TABS: 0.5000 mg | ORAL_TABLET | Freq: Two times a day (BID) | ORAL | 0 refills | Status: DC | PRN

## 2017-04-20 MED ORDER — AMLODIPINE BESYLATE 5 MG PO TABS: 5.0000 mg | ORAL_TABLET | Freq: Every day | ORAL | 3 refills | Status: DC

## 2017-04-20 MED ORDER — CHLORTHALIDONE 25 MG PO TABS: 25.0000 mg | ORAL_TABLET | Freq: Every day | ORAL | 3 refills | Status: DC

## 2017-04-20 NOTE — Progress Notes (Signed)
Subjective:    Patient ID: Juan Franklin, male    DOB: 1976/01/27, 42 y.o.   MRN: [id number]  HPI Here due to concern about his BP  Out of work for 3 weeks Bad leg pain and weakness Had MRI and appts with Dr Franky Macho BP very high there---and high at home  178/124 is 3 day average 230/150 the first time at [facility]  No headache No vision change No chest pain No SOB  Current Outpatient Medications on File Prior to Visit  Medication Sig Dispense Refill  . clonazePAM (KLONOPIN) 0.5 MG tablet TAKE 1 TABLET BY MOUTH TWICE A DAY AS NEEDED 60 tablet 0  . metoprolol succinate (TOPROL-XL) 25 MG 24 hr tablet Take 1 tablet (25 mg total) by mouth daily. 90 tablet 3  . montelukast (SINGULAIR) 10 MG tablet TAKE 1 TABLET BY MOUTH AT BEDTIME 90 tablet 0  . nystatin-triamcinolone (MYCOLOG II) cream Apply 1 application topically 2 (two) times daily as needed.    Marland Kitchen oxyCODONE (OXY IR/ROXICODONE) 5 MG immediate release tablet Take 5 mg by mouth every 6 (six) hours as needed for severe pain.     No current facility-administered medications on file prior to visit.     Allergies  Allergen Reactions  . Hydrocodone-Homatropine     REACTION: Itching. Has tolerated vicodin in the past  . Lisinopril     REACTION: cough  . Losartan Potassium     REACTION: cough---not positive though    Past Medical History:  Diagnosis Date  . Allergic rhinitis   . Anxiety   . GERD (gastroesophageal reflux disease)   . Hypertension   . Obstructive sleep apnea     Past Surgical History:  Procedure Laterality Date  . LUMBAR DISC SURGERY  5/10   Dr. Franky Macho  . LUMBAR DISC SURGERY  1/12   L4-5  . NASAL SINUS SURGERY  10/10   Dr. Willeen Cass    Family History  Problem Relation Age of Onset  . Cancer Mother        Breast    Social History   Socioeconomic History  . Marital status: Divorced    Spouse name: Not on file  . Number of children: 1  . Years of education: Not on file  . Highest education  level: Not on file  Social Needs  . Financial resource strain: Not on file  . Food insecurity - worry: Not on file  . Food insecurity - inability: Not on file  . Transportation needs - medical: Not on file  . Transportation needs - non-medical: Not on file  Occupational History  . Occupation: Curator, Pharmacologist    Comment: Supervisor  Tobacco Use  . Smoking status: Current Every Day Smoker    Packs/day: 1.00    Years: 17.00    Pack years: 17.00    Types: Cigarettes  . Smokeless tobacco: Never Used  Substance and Sexual Activity  . Alcohol use: No    Alcohol/week: 0.0 oz    Comment: Quit after 3 DUI's.  Now drinks 6-8 at night when he doesn't have his daughter.  . Drug use: Not on file  . Sexual activity: Not on file  Other Topics Concern  . Not on file  Social History Narrative       Review of Systems  Has been living in [place] and working 4 days per week there till disabled Not sleeping great--tries not to take the clonazepam, makes him groggy in the AM Gained weight--lots of  fast food. Not drinking lately      Objective:   Physical Exam  Constitutional: No distress.  Neck: No thyromegaly present.  Cardiovascular: Normal rate, regular rhythm and normal heart sounds. Exam reveals no gallop.  No murmur heard. Pulmonary/Chest: Effort normal and breath sounds normal. No respiratory distress. He has no wheezes. He has no rales.  Musculoskeletal: He exhibits no edema.  Lymphadenopathy:    He has no cervical adenopathy.  Psychiatric: He has a normal mood and affect. His behavior is normal.          Assessment & Plan:

## 2017-04-20 NOTE — Addendum Note (Signed)
Addended by: Annamarie MajorFUQUAY, Rayshawn Visconti S on: 04/20/2017 05:42 PM   Modules accepted: Orders

## 2017-04-20 NOTE — Assessment & Plan Note (Signed)
BP Readings from Last 3 Encounters:  04/20/17 (!) 178/94  09/07/16 136/80  06/22/16 138/90   Repeat by me on right 210/140 No PE findings of concern Gained weight due to being sedentary and fast food--this is not enough to explain this type of increase Pain may be a factor as well  No proteinuria Will check labs Start chlorthalidone and amlodipine Follow up when he is back in town---he will monitor

## 2017-04-21 LAB — COMPREHENSIVE METABOLIC PANEL
ALK PHOS: 94 U/L (ref 39–117)
ALT: 73 U/L — AB (ref 0–53)
AST: 48 U/L — ABNORMAL HIGH (ref 0–37)
Albumin: 4.5 g/dL (ref 3.5–5.2)
BUN: 21 mg/dL (ref 6–23)
CO2: 24 mEq/L (ref 19–32)
Calcium: 9.3 mg/dL (ref 8.4–10.5)
Chloride: 102 mEq/L (ref 96–112)
Creatinine, Ser: 0.82 mg/dL (ref 0.40–1.50)
GFR: 109.94 mL/min (ref >60.00)
GLUCOSE: 88 mg/dL (ref 70–99)
POTASSIUM: 4.2 meq/L (ref 3.5–5.1)
Sodium: 138 mEq/L (ref 135–145)
TOTAL PROTEIN: 7.4 g/dL (ref 6.0–8.3)
Total Bilirubin: 0.4 mg/dL (ref 0.2–1.2)

## 2017-04-21 LAB — CBC
HEMATOCRIT: 43.8 % (ref 39.0–52.0)
Hemoglobin: 14.8 g/dL (ref 13.0–17.0)
MCHC: 33.9 g/dL (ref 30.0–36.0)
MCV: 91.4 fl (ref 78.0–100.0)
PLATELETS: 285 10*3/uL (ref 150.0–400.0)
RBC: 4.79 Mil/uL (ref 4.22–5.81)
RDW: 13.5 % (ref 11.5–15.5)
WBC: 9.9 10*3/uL (ref 4.0–10.5)

## 2017-04-21 LAB — T4, FREE: Free T4: 0.65 ng/dL (ref 0.60–1.60)

## 2017-05-01 ENCOUNTER — Ambulatory Visit (INDEPENDENT_AMBULATORY_CARE_PROVIDER_SITE_OTHER): Payer: PRIVATE HEALTH INSURANCE | Admitting: Internal Medicine

## 2017-05-01 ENCOUNTER — Encounter: Payer: Self-pay | Admitting: Internal Medicine

## 2017-05-01 VITALS — BP 156/106 | HR 88 | Temp 98.1°F | Wt 382.0 lb

## 2017-05-01 DIAGNOSIS — I10 Essential (primary) hypertension: Secondary | ICD-10-CM | POA: Diagnosis not present

## 2017-05-01 DIAGNOSIS — J309 Allergic rhinitis, unspecified: Secondary | ICD-10-CM | POA: Diagnosis not present

## 2017-05-01 MED ORDER — LOSARTAN POTASSIUM 100 MG PO TABS: 100.0000 mg | ORAL_TABLET | Freq: Every day | ORAL | 3 refills | Status: DC

## 2017-05-01 MED ORDER — MONTELUKAST SODIUM 10 MG PO TABS: 10.0000 mg | ORAL_TABLET | Freq: Every day | ORAL | 3 refills | Status: DC

## 2017-05-01 NOTE — Progress Notes (Signed)
Subjective:    Patient ID: Juan CobbMichael K Studley, male    DOB: 08-21-1975, 42 y.o.   MRN: [id number]  HPI Here for follow up of accelerated HTN  May feel a little more tired --hard to tell No distinct side efects Generally about 150/100 at home (much improved) Normal edema--- nothing more on the amlodipine No chest pain, SOB, headaches  Current Outpatient Medications on File Prior to Visit  Medication Sig Dispense Refill  . amLODipine (NORVASC) 5 MG tablet Take 1 tablet (5 mg total) by mouth daily. 90 tablet 3  . chlorthalidone (HYGROTON) 25 MG tablet Take 1 tablet (25 mg total) by mouth daily. 90 tablet 3  . clonazePAM (KLONOPIN) 0.5 MG tablet Take 1 tablet (0.5 mg total) by mouth 2 (two) times daily as needed. 60 tablet 0  . metoprolol succinate (TOPROL-XL) 25 MG 24 hr tablet Take 1 tablet (25 mg total) by mouth daily. 90 tablet 3  . montelukast (SINGULAIR) 10 MG tablet TAKE 1 TABLET BY MOUTH AT BEDTIME 90 tablet 0  . nystatin-triamcinolone (MYCOLOG II) cream Apply 1 application topically 2 (two) times daily as needed.    Marland Kitchen. oxyCODONE (OXY IR/ROXICODONE) 5 MG immediate release tablet Take 5 mg by mouth every 6 (six) hours as needed for severe pain.     No current facility-administered medications on file prior to visit.     Allergies  Allergen Reactions  . Hydrocodone-Homatropine     REACTION: Itching. Has tolerated vicodin in the past  . Lisinopril     REACTION: cough  . Losartan Potassium     REACTION: cough---not positive though    Past Medical History:  Diagnosis Date  . Allergic rhinitis   . Anxiety   . GERD (gastroesophageal reflux disease)   . Hypertension   . Obstructive sleep apnea     Past Surgical History:  Procedure Laterality Date  . LUMBAR DISC SURGERY  5/10   Dr. Franky Machoabbell  . LUMBAR DISC SURGERY  1/12   L4-5  . NASAL SINUS SURGERY  10/10   Dr. Willeen CassBennett    Family History  Problem Relation Age of Onset  . Cancer Mother        Breast    Social  History   Socioeconomic History  . Marital status: Divorced    Spouse name: Not on file  . Number of children: 1  . Years of education: Not on file  . Highest education level: Not on file  Social Needs  . Financial resource strain: Not on file  . Food insecurity - worry: Not on file  . Food insecurity - inability: Not on file  . Transportation needs - medical: Not on file  . Transportation needs - non-medical: Not on file  Occupational History  . 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 Smoking status: Current Every Day Smoker    Packs/day: 1.00    Years: 17.00    Pack years: 17.00    Types: Cigarettes  . Smokeless tobacco: Never Used  Substance and Sexual Activity  . Alcohol use: No    Alcohol/week: 0.0 oz    Comment: Quit after 3 DUI's.  Now drinks 6-8 at night when he doesn't have his daughter.  . Drug use: Not on file  . Sexual activity: Not on file  Other Topics Concern  . Not on file  Social History Narrative       Review of Systems Just NCV study today Did go  back to work last week Occasional facial flushing--not much No diarrhea Has improved diet---still only in room without kitchen faciliities    Objective:   Physical Exam  Constitutional: No distress.  Neck: No thyromegaly present.  Cardiovascular: Normal rate, regular rhythm and normal heart sounds. Exam reveals no gallop.  No murmur heard. Pulmonary/Chest: Effort normal and breath sounds normal. No respiratory distress. He has no wheezes. He has no rales.  Musculoskeletal:  Thick calves but no pitting  Lymphadenopathy:    He has no cervical adenopathy.          Assessment & Plan:

## 2017-05-01 NOTE — Assessment & Plan Note (Addendum)
BP Readings from Last 3 Encounters:  05/01/17 (!) 156/106  04/20/17 (!) 178/94  09/07/16 136/80   Repeat 156/110 on right (much lower than last time) Tolerating new meds Will retry losartan--thought it might have caused cough in past, but it was soon after cough from lisinopril (and I think it may have been residual) Will check blood work at next visit

## 2017-06-09 ENCOUNTER — Other Ambulatory Visit: Payer: Self-pay | Admitting: Internal Medicine

## 2017-06-09 NOTE — Telephone Encounter (Signed)
Last filled 04-20-17 #60 Last OV 05-01-17 Next OV 06-23-17

## 2017-06-12 ENCOUNTER — Ambulatory Visit: Payer: PRIVATE HEALTH INSURANCE | Admitting: Internal Medicine

## 2017-06-23 ENCOUNTER — Encounter: Payer: Self-pay | Admitting: Internal Medicine

## 2017-06-23 ENCOUNTER — Ambulatory Visit (INDEPENDENT_AMBULATORY_CARE_PROVIDER_SITE_OTHER): Payer: PRIVATE HEALTH INSURANCE | Admitting: Internal Medicine

## 2017-06-23 VITALS — BP 128/88 | HR 75 | Temp 98.2°F | Ht 73.0 in | Wt 375.0 lb

## 2017-06-23 DIAGNOSIS — I10 Essential (primary) hypertension: Secondary | ICD-10-CM | POA: Diagnosis not present

## 2017-06-23 LAB — RENAL FUNCTION PANEL
Albumin: 4.4 g/dL (ref 3.5–5.2)
BUN: 18 mg/dL (ref 6–23)
CO2: 29 meq/L (ref 19–32)
CREATININE: 0.86 mg/dL (ref 0.40–1.50)
Calcium: 9.6 mg/dL (ref 8.4–10.5)
Chloride: 101 mEq/L (ref 96–112)
GFR: 103.97 mL/min (ref >60.00)
GLUCOSE: 102 mg/dL — AB (ref 70–99)
PHOSPHORUS: 4.2 mg/dL (ref 2.3–4.6)
Potassium: 4.1 mEq/L (ref 3.5–5.1)
SODIUM: 139 meq/L (ref 135–145)

## 2017-06-23 NOTE — Assessment & Plan Note (Signed)
BP Readings from Last 3 Encounters:  06/23/17 128/88  05/01/17 (!) 156/106  04/20/17 (!) 178/94   Repeat 134/94 on right Much better Will continue current meds Back to routine follow up Check labs now

## 2017-06-23 NOTE — Progress Notes (Signed)
Subjective:    Patient ID: Juan CobbMichael K Silberman, male    DOB: 10-09-1975, 42 y.o.   MRN: [id number]  HPI Here for follow up of accelerated HTN  He generally feels okay--but feels sluggish at times No recent vacation---lost time due to lost time with back problems  No chest pain No headaches No SOB No dizziness or syncope No sig edema--his usual  Current Outpatient Medications on File Prior to Visit  Medication Sig Dispense Refill  . amLODipine (NORVASC) 5 MG tablet Take 1 tablet (5 mg total) by mouth daily. 90 tablet 3  . chlorthalidone (HYGROTON) 25 MG tablet Take 1 tablet (25 mg total) by mouth daily. 90 tablet 3  . clonazePAM (KLONOPIN) 0.5 MG tablet TAKE 1 TABLET BY MOUTH 2 TIMES DAILY AS NEEDED 60 tablet 0  . losartan (COZAAR) 100 MG tablet Take 1 tablet (100 mg total) by mouth daily. 90 tablet 3  . metoprolol succinate (TOPROL-XL) 25 MG 24 hr tablet Take 1 tablet (25 mg total) by mouth daily. 90 tablet 3  . montelukast (SINGULAIR) 10 MG tablet Take 1 tablet (10 mg total) by mouth at bedtime. 90 tablet 3  . nystatin-triamcinolone (MYCOLOG II) cream Apply 1 application topically 2 (two) times daily as needed.    Marland Kitchen. oxyCODONE (OXY IR/ROXICODONE) 5 MG immediate release tablet Take 5 mg by mouth every 6 (six) hours as needed for severe pain.     No current facility-administered medications on file prior to visit.     Allergies  Allergen Reactions  . Hydrocodone-Homatropine     REACTION: Itching. Has tolerated vicodin in the past  . Lisinopril     REACTION: cough    Past Medical History:  Diagnosis Date  . Allergic rhinitis   . Anxiety   . GERD (gastroesophageal reflux disease)   . Hypertension   . Obstructive sleep apnea     Past Surgical History:  Procedure Laterality Date  . LUMBAR DISC SURGERY  5/10   Dr. Franky Machoabbell  . LUMBAR DISC SURGERY  1/12   L4-5  . NASAL SINUS SURGERY  10/10   Dr. Willeen CassBennett    Family History  Problem Relation Age of Onset  . Cancer Mother         Breast    Social History   Socioeconomic History  . Marital status: Divorced    Spouse name: Not on file  . Number of children: 1  . Years of education: Not on file  . Highest education level: Not on file  Occupational History  . 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 Financial resource strain: Not on file  . Food insecurity:    Worry: Not on file    Inability: Not on file  . Transportation needs:    Medical: Not on file    Non-medical: Not on file  Tobacco Use  . Smoking status: Current Every Day Smoker    Packs/day: 1.00    Years: 17.00    Pack years: 17.00    Types: Cigarettes  . Smokeless tobacco: Never Used  Substance and Sexual Activity  . Alcohol use: No    Alcohol/week: 0.0 oz    Comment: Quit after 3 DUI's.  Now drinks 6-8 at night when he doesn't have his daughter.  . Drug use: Not on file  . Sexual activity: Not on file  Lifestyle  . Physical activity:    Days per week: Not on file    Minutes  per session: Not on file  . Stress: Not on file  Relationships  . Social connections:    Talks on phone: Not on file    Gets together: Not on file    Attends religious service: Not on file    Active member of club or organization: Not on file    Attends meetings of clubs or organizations: Not on file    Relationship status: Not on file  . Intimate partner violence:    Fear of current or ex partner: Not on file    Emotionally abused: Not on file    Physically abused: Not on file    Forced sexual activity: Not on file  Other Topics Concern  . Not on file  Social History Narrative       Review of Systems  Tosses and turns at night Shoulders "sore as hell" Tries to avoid the clonazepam--makes him groggy in AM (told him to try 1/2)     Objective:   Physical Exam  Constitutional: No distress.  Neck: No thyromegaly present.  Cardiovascular: Normal rate, regular rhythm and normal heart sounds. Exam reveals no  gallop.  No murmur heard. Pulmonary/Chest: Effort normal and breath sounds normal. No respiratory distress. He has no wheezes. He has no rales.  Musculoskeletal: He exhibits no edema.  Lymphadenopathy:    He has no cervical adenopathy.          Assessment & Plan:

## 2017-07-04 ENCOUNTER — Other Ambulatory Visit: Payer: Self-pay | Admitting: Internal Medicine

## 2017-07-04 NOTE — Telephone Encounter (Signed)
Last Rx 06/09/2017 #60. Last OV 05/2017

## 2017-08-02 ENCOUNTER — Other Ambulatory Visit: Payer: Self-pay | Admitting: Internal Medicine

## 2017-08-02 NOTE — Telephone Encounter (Signed)
Last filled 07-04-17 #60 Last OV 06-23-17 Next OV 09-18-17  Forward to Dr Patsy Lager in Dr Karle Starch absence

## 2017-09-02 ENCOUNTER — Other Ambulatory Visit: Payer: Self-pay | Admitting: Family Medicine

## 2017-09-04 NOTE — Telephone Encounter (Signed)
Last office visit 06/23/2017.  Last refilled 08/02/2017 for #60 with no refills.  Ok to refill?

## 2017-09-08 ENCOUNTER — Encounter: Payer: PRIVATE HEALTH INSURANCE | Admitting: Internal Medicine

## 2017-09-18 ENCOUNTER — Ambulatory Visit: Payer: Self-pay | Admitting: Internal Medicine

## 2017-09-18 ENCOUNTER — Encounter: Payer: Self-pay | Admitting: Internal Medicine

## 2017-09-18 VITALS — BP 142/104 | HR 85 | Temp 98.1°F | Ht 72.25 in | Wt 377.0 lb

## 2017-09-18 DIAGNOSIS — Z0000 Encounter for general adult medical examination without abnormal findings: Secondary | ICD-10-CM

## 2017-09-18 DIAGNOSIS — I10 Essential (primary) hypertension: Secondary | ICD-10-CM

## 2017-09-18 DIAGNOSIS — G4733 Obstructive sleep apnea (adult) (pediatric): Secondary | ICD-10-CM

## 2017-09-18 MED ORDER — AMLODIPINE BESYLATE 10 MG PO TABS: 10.0000 mg | ORAL_TABLET | Freq: Every day | ORAL | 3 refills | Status: DC

## 2017-09-18 MED ORDER — METOPROLOL SUCCINATE ER 50 MG PO TB24: 50.0000 mg | ORAL_TABLET | Freq: Every day | ORAL | 3 refills | Status: DC

## 2017-09-18 NOTE — Progress Notes (Signed)
Subjective:    Patient ID: Juan Franklin, male    DOB: 07-08-1975, 42 y.o.   MRN: [id number]  HPI Here for physical  Has been monitoring his BP--"maybe too much" May be as high as 200/165--but that may be after smoking Lowest is 130/85 Not following DASH eating No exercise Still drinking a lot--- as much as 12 beers a day Now unemployed--taking some time off before looking again Finds his attention span is really bad now Still smoking  Mild chronic edema  Current Outpatient Medications on File Prior to Visit  Medication Sig Dispense Refill  . amLODipine (NORVASC) 5 MG tablet Take 1 tablet (5 mg total) by mouth daily. 90 tablet 3  . chlorthalidone (HYGROTON) 25 MG tablet Take 1 tablet (25 mg total) by mouth daily. 90 tablet 3  . clonazePAM (KLONOPIN) 0.5 MG tablet TAKE 1 TABLET BY MOUTH TWICE A DAY AS NEEDED 60 tablet 0  . losartan (COZAAR) 100 MG tablet Take 1 tablet (100 mg total) by mouth daily. 90 tablet 3  . metoprolol succinate (TOPROL-XL) 25 MG 24 hr tablet Take 1 tablet (25 mg total) by mouth daily. 90 tablet 3  . montelukast (SINGULAIR) 10 MG tablet Take 1 tablet (10 mg total) by mouth at bedtime. 90 tablet 3  . nystatin-triamcinolone (MYCOLOG II) cream Apply 1 application topically 2 (two) times daily as needed.    Marland Kitchen. oxyCODONE (OXY IR/ROXICODONE) 5 MG immediate release tablet Take 5 mg by mouth every 6 (six) hours as needed for severe pain.     No current facility-administered medications on file prior to visit.     Allergies  Allergen Reactions  . Hydrocodone-Homatropine     REACTION: Itching. Has tolerated vicodin in the past  . Lisinopril     REACTION: cough    Past Medical History:  Diagnosis Date  . Allergic rhinitis   . Anxiety   . GERD (gastroesophageal reflux disease)   . Hypertension   . Obstructive sleep apnea     Past Surgical History:  Procedure Laterality Date  . LUMBAR DISC SURGERY  5/10   Dr. Franky Machoabbell  . LUMBAR DISC SURGERY  1/12   L4-5  . NASAL SINUS SURGERY  10/10   Dr. Willeen CassBennett    Family History  Problem Relation Age of Onset  . Cancer Mother        Breast    Social History   Socioeconomic History  . Marital status: Divorced    Spouse name: Not on file  . Number of children: 1  . Years of education: Not on file  . Highest education level: Not on file  Occupational History  . Occupation: CuratorMechanic, [employer]    Comment: Unemployed as of 4/19  Social Needs  . Financial resource strain: Not on file  . Food insecurity:    Worry: Not on file    Inability: Not on file  . Transportation needs:    Medical: Not on file    Non-medical: Not on file  Tobacco Use  . Smoking status: Current Every Day Smoker    Packs/day: 1.00    Years: 17.00    Pack years: 17.00    Types: Cigarettes  . Smokeless tobacco: Never Used  Substance and Sexual Activity  . Alcohol use: No    Alcohol/week: 0.0 oz    Comment: Quit after 3 DUI's.  Now drinks 6-8 at night when he doesn't have his daughter.  . Drug use: Not on file  . Sexual  activity: Not on file  Lifestyle  . Physical activity:    Days per week: Not on file    Minutes per session: Not on file  . Stress: Not on file  Relationships  . Social connections:    Talks on phone: Not on file    Gets together: Not on file    Attends religious service: Not on file    Active member of club or organization: Not on file    Attends meetings of clubs or organizations: Not on file    Relationship status: Not on file  . Intimate partner violence:    Fear of current or ex partner: Not on file    Emotionally abused: Not on file    Physically abused: Not on file    Forced sexual activity: Not on file  Other Topics Concern  . Not on file  Social History Narrative       Review of Systems  Constitutional: Negative for unexpected weight change.       Wears seat belt  HENT: Negative for dental problem, hearing loss, tinnitus and trouble swallowing.        Keeps up with  dentist  Eyes: Negative for visual disturbance.       No diplopia or unilateral vision loss  Respiratory: Negative for cough, chest tightness and shortness of breath.   Cardiovascular: Positive for palpitations and leg swelling. Negative for chest pain.  Gastrointestinal: Negative for blood in stool and constipation.       No heartburn  Endocrine: Negative for polydipsia and polyuria.  Genitourinary: Negative for difficulty urinating, frequency and urgency.       No sexual problems  Musculoskeletal: Positive for arthralgias and back pain. Negative for joint swelling.  Skin: Negative for rash.  Allergic/Immunologic: Negative for environmental allergies and immunocompromised state.  Neurological: Negative for dizziness, syncope, light-headedness and headaches.  Hematological: Negative for adenopathy. Does not bruise/bleed easily.  Psychiatric/Behavioral: Positive for sleep disturbance.       Using CBD oil to help sleep---trying to avoid the klonopin. Does help him initiate sleep. Still tosses and turns Uses the CPAP nightly Mild depression--from being out of work. Not serious       Objective:   Physical Exam  Constitutional: He is oriented to person, place, and time. He appears well-developed. No distress.  HENT:  Head: Normocephalic and atraumatic.  Right Ear: External ear normal.  Left Ear: External ear normal.  Mouth/Throat: Oropharynx is clear and moist. No oropharyngeal exudate.  Eyes: Pupils are equal, round, and reactive to light. Conjunctivae are normal.  Neck: No thyromegaly present.  Cardiovascular: Normal rate, regular rhythm, normal heart sounds and intact distal pulses. Exam reveals no gallop.  No murmur heard. Respiratory: Effort normal and breath sounds normal. No respiratory distress. He has no wheezes. He has no rales.  GI: Soft. There is no tenderness.  Musculoskeletal: He exhibits no edema or tenderness.  Lymphadenopathy:    He has no cervical adenopathy.    Neurological: He is alert and oriented to person, place, and time.  Skin: No rash noted. No erythema.  Small cyst at PIP left 4th finger  Psychiatric: He has a normal mood and affect. His behavior is normal.           Assessment & Plan:

## 2017-09-18 NOTE — Assessment & Plan Note (Signed)
BP Readings from Last 3 Encounters:  09/18/17 (!) 142/104  06/23/17 128/88  05/01/17 (!) 156/106   Still inadequately controlled Will double amlodipine and metoprolol Recheck 2 months

## 2017-09-18 NOTE — Patient Instructions (Signed)
Take 2 of the amlodipine and metoprolol each day--then start the higher doses when they run out.

## 2017-09-18 NOTE — Assessment & Plan Note (Signed)
Using CPAP and CBD oil

## 2017-09-18 NOTE — Assessment & Plan Note (Signed)
Really needs to work on his lifestyle--counseled Now looking for work

## 2017-10-04 ENCOUNTER — Other Ambulatory Visit: Payer: Self-pay | Admitting: Internal Medicine

## 2017-10-05 NOTE — Telephone Encounter (Signed)
Last filled 09-04-17 #60 Last OV 09-18-17 Next OV 11-30-17 [facility]

## 2017-11-02 ENCOUNTER — Other Ambulatory Visit: Payer: Self-pay | Admitting: Internal Medicine

## 2017-11-02 NOTE — Telephone Encounter (Signed)
Last filled 10-05-17 #60 Last OV  09-18-17 Next OV 11-30-17

## 2017-11-30 ENCOUNTER — Ambulatory Visit: Payer: Self-pay | Admitting: Internal Medicine

## 2017-11-30 ENCOUNTER — Other Ambulatory Visit: Payer: Self-pay | Admitting: Internal Medicine

## 2017-11-30 ENCOUNTER — Encounter: Payer: Self-pay | Admitting: Internal Medicine

## 2017-11-30 VITALS — BP 132/90 | HR 82 | Temp 98.5°F | Ht 72.0 in | Wt 387.0 lb

## 2017-11-30 DIAGNOSIS — I10 Essential (primary) hypertension: Secondary | ICD-10-CM

## 2017-11-30 MED ORDER — PREDNISONE 20 MG PO TABS: 40.0000 mg | ORAL_TABLET | Freq: Every day | ORAL | 0 refills | Status: DC

## 2017-11-30 MED ORDER — AMLODIPINE BESYLATE 10 MG PO TABS: 10.0000 mg | ORAL_TABLET | Freq: Every day | ORAL | 3 refills | Status: DC

## 2017-11-30 NOTE — Addendum Note (Signed)
Addended by: Tillman Abide I on: 11/30/2017 03:36 PM   Modules accepted: Orders

## 2017-11-30 NOTE — Assessment & Plan Note (Signed)
BP Readings from Last 3 Encounters:  11/30/17 132/90  09/18/17 (!) 142/104  06/23/17 128/88   Better not Checked with pharmacy--- not taking metoprolol (so will stop) Increase the amlodipine to 10mg  Recheck 6 months

## 2017-11-30 NOTE — Telephone Encounter (Signed)
Last filled 11-02-17 #60 Last OV 09-18-17 Next OV today

## 2017-11-30 NOTE — Progress Notes (Signed)
Subjective:    Patient ID: Juan Franklin, male    DOB: 04/30/1975, 42 y.o.   MRN: [id number]  HPI Here for follow up of uncontrolled HTN Not sure he is taking the medications correctly May not be taking the metoprolol Thinks the amlodipine is 5mg   No apparent side effects Is monitoring his BP--- checks every AM Average 150/76  Current Outpatient Medications on File Prior to Visit  Medication Sig Dispense Refill  . amLODipine (NORVASC) 10 MG tablet Take 1 tablet (10 mg total) by mouth daily. 90 tablet 3  . chlorthalidone (HYGROTON) 25 MG tablet Take 1 tablet (25 mg total) by mouth daily. 90 tablet 3  . losartan (COZAAR) 100 MG tablet Take 1 tablet (100 mg total) by mouth daily. 90 tablet 3  . metoprolol succinate (TOPROL-XL) 50 MG 24 hr tablet Take 1 tablet (50 mg total) by mouth daily. Take with or immediately following a meal. 90 tablet 3  . montelukast (SINGULAIR) 10 MG tablet Take 1 tablet (10 mg total) by mouth at bedtime. 90 tablet 3  . nystatin-triamcinolone (MYCOLOG II) cream Apply 1 application topically 2 (two) times daily as needed.    Marland Kitchen oxyCODONE (OXY IR/ROXICODONE) 5 MG immediate release tablet Take 5 mg by mouth every 6 (six) hours as needed for severe pain.     No current facility-administered medications on file prior to visit.     Allergies  Allergen Reactions  . Hydrocodone-Homatropine     REACTION: Itching. Has tolerated vicodin in the past  . Lisinopril     REACTION: cough    Past Medical History:  Diagnosis Date  . Allergic rhinitis   . Anxiety   . GERD (gastroesophageal reflux disease)   . Hypertension   . Obstructive sleep apnea     Past Surgical History:  Procedure Laterality Date  . LUMBAR DISC SURGERY  5/10   Dr. Franky Macho  . LUMBAR DISC SURGERY  1/12   L4-5  . NASAL SINUS SURGERY  10/10   Dr. Willeen Cass    Family History  Problem Relation Age of Onset  . Cancer Mother        Breast    Social History   Socioeconomic History  .  Marital status: Divorced    Spouse name: Not on file  . Number of children: 1  . Years of education: Not on file  . Highest education level: Not on file  Occupational History  . Occupation: Curator, Pharmacologist    Comment: Unemployed as of 4/19  Social Needs  . Financial resource strain: Not on file  . Food insecurity:    Worry: Not on file    Inability: Not on file  . Transportation needs:    Medical: Not on file    Non-medical: Not on file  Tobacco Use  . Smoking status: Current Every Day Smoker    Packs/day: 1.00    Years: 17.00    Pack years: 17.00    Types: Cigarettes  . Smokeless tobacco: Never Used  Substance and Sexual Activity  . Alcohol use: No    Alcohol/week: 0.0 standard drinks    Comment: Quit after 3 DUI's.  Now drinks 6-8 at night when he doesn't have his daughter.  . Drug use: Not on file  . Sexual activity: Not on file  Lifestyle  . Physical activity:    Days per week: Not on file    Minutes per session: Not on file  . Stress: Not on file  Relationships  . Social connections:    Talks on phone: Not on file    Gets together: Not on file    Attends religious service: Not on file    Active member of club or organization: Not on file    Attends meetings of clubs or organizations: Not on file    Relationship status: Not on file  . Intimate partner violence:    Fear of current or ex partner: Not on file    Emotionally abused: Not on file    Physically abused: Not on file    Forced sexual activity: Not on file  Other Topics Concern  . Not on file  Social History Narrative       Review of Systems  Weight is up 10#--he thinks that is wrong Thinks he just has more muscle Sleep is still restless--tosses and turns May be drinking less now Back pain has recurred--- asks for prednisone burst to ease the pain    Objective:   Physical Exam  Constitutional: No distress.  Neck: No thyromegaly present.  Cardiovascular: Normal rate, regular rhythm,  normal heart sounds and intact distal pulses. Exam reveals no gallop.  No murmur heard. Respiratory: Effort normal and breath sounds normal. No respiratory distress. He has no wheezes. He has no rales.  Musculoskeletal:  Thick calves without pitting  Lymphadenopathy:    He has no cervical adenopathy.           Assessment & Plan:

## 2018-01-30 ENCOUNTER — Other Ambulatory Visit: Payer: Self-pay | Admitting: Internal Medicine

## 2018-01-31 NOTE — Telephone Encounter (Signed)
Last filled 11-30-17 #60 Last OV 09-18-17 Next OV 06-04-18

## 2018-05-05 ENCOUNTER — Other Ambulatory Visit: Payer: Self-pay | Admitting: Internal Medicine

## 2018-05-07 NOTE — Telephone Encounter (Signed)
Last filled 04-06-18 #60 Last OV 11-30-17 Next OV 06-04-18 [facility]

## 2018-06-04 ENCOUNTER — Ambulatory Visit: Payer: Self-pay | Admitting: Internal Medicine

## 2018-06-04 ENCOUNTER — Encounter: Payer: Self-pay | Admitting: Internal Medicine

## 2018-06-04 VITALS — BP 138/90 | HR 87 | Temp 98.5°F | Ht 72.0 in | Wt 390.0 lb

## 2018-06-04 DIAGNOSIS — I10 Essential (primary) hypertension: Secondary | ICD-10-CM

## 2018-06-04 NOTE — Assessment & Plan Note (Signed)
Going back on low carb Discussed fitness and cigarette cessation

## 2018-06-04 NOTE — Progress Notes (Signed)
Subjective:    Patient ID: Juan Franklin, male    DOB: [birth year], 43 y.o.   MRN: [id number]  HPI Here for follow up of HTN  He continues to check regularly Average 156/90 He wonders about his cuff  No headaches No chest pain No SOB No palpitations  No dizziness or syncope  Current Outpatient Medications on File Prior to Visit  Medication Sig Dispense Refill  . chlorthalidone (HYGROTON) 25 MG tablet TAKE 1 TABLET BY MOUTH ONCE DAILY 90 tablet 0  . clonazePAM (KLONOPIN) 0.5 MG tablet TAKE 1 TABLET BY MOUTH TWICE A DAY AS NEEDED 60 tablet 0  . losartan (COZAAR) 100 MG tablet TAKE 1 TABLET BY MOUTH ONCE DAILY 90 tablet 0  . montelukast (SINGULAIR) 10 MG tablet Take 1 tablet (10 mg total) by mouth at bedtime. 90 tablet 3  . nystatin-triamcinolone (MYCOLOG II) cream Apply 1 application topically 2 (two) times daily as needed.    . predniSONE (DELTASONE) 20 MG tablet Take 2 tablets (40 mg total) by mouth daily. For 3 days, then 1 tab daily for 3 days (Patient not taking: Reported on 06/04/2018) 9 tablet 0   No current facility-administered medications on file prior to visit.     Allergies  Allergen Reactions  . Hydrocodone-Homatropine     REACTION: Itching. Has tolerated vicodin in the past  . Lisinopril     REACTION: cough    Past Medical History:  Diagnosis Date  . Allergic rhinitis   . Anxiety   . GERD (gastroesophageal reflux disease)   . Hypertension   . Obstructive sleep apnea     Past Surgical History:  Procedure Laterality Date  . LUMBAR DISC SURGERY  5/10   Dr. Franky Macho  . LUMBAR DISC SURGERY  1/12   L4-5  . NASAL SINUS SURGERY  10/10   Dr. Willeen Cass    Family History  Problem Relation Age of Onset  . Cancer Mother        Breast    Social History   Socioeconomic History  . Marital status: Divorced    Spouse name: Not on file  . Number of children: 1  . Years of education: Not on file  . Highest education level: Not on file  Occupational History   . Occupation: Grading work    Comment:    Social Needs  . Financial resource strain: Not on file  . Food insecurity:    Worry: Not on file    Inability: Not on file  . Transportation needs:    Medical: Not on file    Non-medical: Not on file  Tobacco Use  . Smoking status: Current Every Day Smoker    Packs/day: 1.00    Years: 17.00    Pack years: 17.00    Types: Cigarettes  . Smokeless tobacco: Never Used  Substance and Sexual Activity  . Alcohol use: No    Alcohol/week: 0.0 standard drinks    Comment: Quit after 3 DUI's.  Now drinks 6-8 at night when he doesn't have his daughter.  . Drug use: Not on file  . Sexual activity: Not on file  Lifestyle  . Physical activity:    Days per week: Not on file    Minutes per session: Not on file  . Stress: Not on file  Relationships  . Social connections:    Talks on phone: Not on file    Gets together: Not on file    Attends religious service: Not on file  Active member of club or organization: Not on file    Attends meetings of clubs or organizations: Not on file    Relationship status: Not on file  . Intimate partner violence:    Fear of current or ex partner: Not on file    Emotionally abused: Not on file    Physically abused: Not on file    Forced sexual activity: Not on file  Other Topics Concern  . Not on file  Social History Narrative       Review of Systems  Still smokes---not ready to quit Still has multiple beers nightly with his friends Weight has continued to go up---has restarted low carb diet (has lost 10#) Gets occasional back pains--prednisone will help     Objective:   Physical Exam  Constitutional: He appears well-developed. No distress.  Neck: No thyromegaly present.  Cardiovascular: Normal rate, regular rhythm and normal heart sounds. Exam reveals no gallop.  No murmur heard. Respiratory: Effort normal and breath sounds normal. No respiratory distress. He has no wheezes. He has no rales.    Musculoskeletal:     Comments: Thick calves but no pitting  Lymphadenopathy:    He has no cervical adenopathy.           Assessment & Plan:

## 2018-06-04 NOTE — Assessment & Plan Note (Signed)
BP Readings from Last 3 Encounters:  06/04/18 138/90  11/30/17 132/90  09/18/17 (!) 142/104   Reasonable control

## 2018-06-05 ENCOUNTER — Other Ambulatory Visit: Payer: Self-pay | Admitting: Internal Medicine

## 2018-06-05 MED ORDER — PREDNISONE 20 MG PO TABS: 40.0000 mg | ORAL_TABLET | Freq: Every day | ORAL | 0 refills | Status: DC

## 2018-06-05 NOTE — Telephone Encounter (Signed)
Last filled 05-07-18 #60 Last OV 06-04-18 Next OV 12-07-18 [facility]

## 2018-06-05 NOTE — Addendum Note (Signed)
Addended by: Tillman Abide I on: 06/05/2018 07:37 AM   Modules accepted: Orders

## 2018-06-06 ENCOUNTER — Other Ambulatory Visit: Payer: Self-pay | Admitting: Internal Medicine

## 2018-06-06 DIAGNOSIS — J309 Allergic rhinitis, unspecified: Secondary | ICD-10-CM

## 2018-07-04 ENCOUNTER — Other Ambulatory Visit: Payer: Self-pay | Admitting: Internal Medicine

## 2018-07-04 NOTE — Telephone Encounter (Signed)
Last filled 06-05-18 #60 Last OV 06-04-18 Next OV 12-07-18 [facility]

## 2018-07-11 ENCOUNTER — Other Ambulatory Visit: Payer: Self-pay | Admitting: Internal Medicine

## 2018-07-16 ENCOUNTER — Other Ambulatory Visit: Payer: Self-pay | Admitting: Internal Medicine

## 2018-07-16 NOTE — Telephone Encounter (Signed)
Pt requests Rx refill of Deltasone 20 MG. LOV 06/04/2018. Sen to Dr. Alphonsus Sias for review.

## 2018-08-03 ENCOUNTER — Other Ambulatory Visit: Payer: Self-pay | Admitting: Internal Medicine

## 2018-08-06 NOTE — Telephone Encounter (Signed)
Last filled 07-04-18 #90 Last OV 06-04-18 Next OV 12-07-18 [facility]

## 2018-08-30 ENCOUNTER — Other Ambulatory Visit: Payer: Self-pay | Admitting: Internal Medicine

## 2018-08-30 NOTE — Telephone Encounter (Signed)
Last filled 07-04-18 #60 Last OV 06-04-18 Next OV 12-07-18 [facility]

## 2018-10-29 ENCOUNTER — Other Ambulatory Visit: Payer: Self-pay | Admitting: Internal Medicine

## 2018-12-01 ENCOUNTER — Other Ambulatory Visit: Payer: Self-pay | Admitting: Internal Medicine

## 2018-12-01 NOTE — Telephone Encounter (Signed)
LOV 06/04/2018 Last refilled on 08/30/2018 for #60 with 0 refill Future appointment on 12/07/2018

## 2018-12-07 ENCOUNTER — Other Ambulatory Visit: Payer: Self-pay

## 2018-12-07 ENCOUNTER — Encounter: Payer: Self-pay | Admitting: Internal Medicine

## 2018-12-07 ENCOUNTER — Ambulatory Visit (INDEPENDENT_AMBULATORY_CARE_PROVIDER_SITE_OTHER): Payer: BC Managed Care – PPO | Admitting: Internal Medicine

## 2018-12-07 VITALS — BP 134/80 | HR 80 | Temp 98.2°F | Ht 72.5 in | Wt 377.0 lb

## 2018-12-07 DIAGNOSIS — M545 Low back pain, unspecified: Secondary | ICD-10-CM

## 2018-12-07 DIAGNOSIS — Z0000 Encounter for general adult medical examination without abnormal findings: Secondary | ICD-10-CM

## 2018-12-07 DIAGNOSIS — I10 Essential (primary) hypertension: Secondary | ICD-10-CM

## 2018-12-07 LAB — COMPREHENSIVE METABOLIC PANEL
ALT: 59 U/L — ABNORMAL HIGH (ref 0–53)
AST: 34 U/L (ref 0–37)
Albumin: 4.4 g/dL (ref 3.5–5.2)
Alkaline Phosphatase: 111 U/L (ref 39–117)
BUN: 16 mg/dL (ref 6–23)
CO2: 28 mEq/L (ref 19–32)
Calcium: 9.4 mg/dL (ref 8.4–10.5)
Chloride: 102 mEq/L (ref 96–112)
Creatinine, Ser: 0.86 mg/dL (ref 0.40–1.50)
GFR: 97.14 mL/min (ref >60.00)
Glucose, Bld: 94 mg/dL (ref 70–99)
Potassium: 4 mEq/L (ref 3.5–5.1)
Sodium: 139 mEq/L (ref 135–145)
Total Bilirubin: 0.5 mg/dL (ref 0.2–1.2)
Total Protein: 6.8 g/dL (ref 6.0–8.3)

## 2018-12-07 LAB — LIPID PANEL
Cholesterol: 192 mg/dL (ref 0–200)
HDL: 42.1 mg/dL (ref >39.00)
LDL Cholesterol: 116 mg/dL — ABNORMAL HIGH (ref 0–99)
NonHDL: 149.82
Total CHOL/HDL Ratio: 5
Triglycerides: 167 mg/dL — ABNORMAL HIGH (ref 0.0–149.0)
VLDL: 33.4 mg/dL (ref 0.0–40.0)

## 2018-12-07 LAB — CBC
HCT: 46.5 % (ref 39.0–52.0)
Hemoglobin: 16 g/dL (ref 13.0–17.0)
MCHC: 34.3 g/dL (ref 30.0–36.0)
MCV: 94.7 fl (ref 78.0–100.0)
Platelets: 259 10*3/uL (ref 150.0–400.0)
RBC: 4.91 Mil/uL (ref 4.22–5.81)
RDW: 14.1 % (ref 11.5–15.5)
WBC: 9.8 10*3/uL (ref 4.0–10.5)

## 2018-12-07 MED ORDER — PREDNISONE 20 MG PO TABS: ORAL_TABLET | ORAL | 0 refills | Status: DC

## 2018-12-07 NOTE — Assessment & Plan Note (Signed)
BP Readings from Last 3 Encounters:  12/07/18 134/80  06/04/18 138/90  11/30/17 132/90   Acceptable control

## 2018-12-07 NOTE — Assessment & Plan Note (Signed)
Healthy but needs to work on fitness, etc Prefers no flu vaccine

## 2018-12-07 NOTE — Assessment & Plan Note (Signed)
Discussed healthier eating and exercise Not ready to stop smoking Cut back on beer Etc.

## 2018-12-07 NOTE — Assessment & Plan Note (Signed)
Radicular pain intermittently  Will give prednisone burst again

## 2018-12-07 NOTE — Progress Notes (Signed)
Subjective:    Patient ID: Juan Franklin, male    DOB: Feb 25, 1976, 43 y.o.   MRN: [id number]  HPI Here for physical  Has a good job again Quality specialist for [employer]  Sleep is still variable Mind will go at times---clonazepam nightly Uses the CPAP every night---this seems to be effective (just changed to a full face mask)  Still smoking---not ready to quit Regular drinking with friends still--discussed controlling amount  Current Outpatient Medications on File Prior to Visit  Medication Sig Dispense Refill  . chlorthalidone (HYGROTON) 25 MG tablet TAKE 1 TABLET BY MOUTH ONCE DAILY 90 tablet 3  . clonazePAM (KLONOPIN) 0.5 MG tablet TAKE 1 TABLET BY MOUTH TWICE A DAY AS NEEDED 60 tablet 0  . losartan (COZAAR) 100 MG tablet TAKE 1 TABLET BY MOUTH ONCE DAILY 90 tablet 3  . montelukast (SINGULAIR) 10 MG tablet TAKE 1 TABLET BY MOUTH AT BEDTIME 90 tablet 3  . nystatin-triamcinolone (MYCOLOG II) cream Apply 1 application topically 2 (two) times daily as needed.     No current facility-administered medications on file prior to visit.     Allergies  Allergen Reactions  . Hydrocodone-Homatropine     REACTION: Itching. Has tolerated vicodin in the past  . Lisinopril     REACTION: cough    Past Medical History:  Diagnosis Date  . Allergic rhinitis   . Anxiety   . GERD (gastroesophageal reflux disease)   . Hypertension   . Obstructive sleep apnea     Past Surgical History:  Procedure Laterality Date  . LUMBAR DISC SURGERY  5/10   Dr. Christella Noa  . LUMBAR DISC SURGERY  1/12   L4-5  . NASAL SINUS SURGERY  10/10   Dr. Richardson Landry    Family History  Problem Relation Age of Onset  . Cancer Mother        Breast    Social History   Socioeconomic History  . Marital status: Divorced    Spouse name: Not on file  . Number of children: 1  . Years of education: Not on file  . Highest education level: Not on file  Occupational History  . Occupation: Quality  control    Comment: [employer]  Social Needs  . Financial resource strain: Not on file  . Food insecurity    Worry: Not on file    Inability: Not on file  . Transportation needs    Medical: Not on file    Non-medical: Not on file  Tobacco Use  . Smoking status: Current Every Day Smoker    Packs/day: 1.00    Years: 17.00    Pack years: 17.00    Types: Cigarettes  . Smokeless tobacco: Never Used  Substance and Sexual Activity  . Alcohol use: No    Alcohol/week: 0.0 standard drinks    Comment: Quit after 3 DUI's.  Now drinks 6-8 at night when he doesn't have his daughter.  . Drug use: Not on file  . Sexual activity: Not on file  Lifestyle  . Physical activity    Days per week: Not on file    Minutes per session: Not on file  . Stress: Not on file  Relationships  . Social Herbalist on phone: Not on file    Gets together: Not on file    Attends religious service: Not on file    Active member of club or organization: Not on file    Attends meetings  of clubs or organizations: Not on file    Relationship status: Not on file  . Intimate partner violence    Fear of current or ex partner: Not on file    Emotionally abused: Not on file    Physically abused: Not on file    Forced sexual activity: Not on file  Other Topics Concern  . Not on file  Social History Narrative       Review of Systems  Constitutional: Negative for fatigue.       Weight is down a few pounds Not exercising--discussed Wears seat belt  HENT: Negative for dental problem.        Some hearing loss/tinnitus in left ear Keeps up with dentist  Eyes: Negative for visual disturbance.       No diplopia or unilateral vision loss  Respiratory: Negative for cough, chest tightness and shortness of breath.   Cardiovascular: Positive for palpitations. Negative for chest pain.       Evening ankle swelling--better in the morning. Discussed support socks  Gastrointestinal: Negative for  abdominal pain, blood in stool and constipation.       No heartburn  Endocrine: Negative for polydipsia and polyuria.  Genitourinary: Negative for difficulty urinating and urgency.       No sexual problems  Musculoskeletal: Positive for back pain. Negative for arthralgias and joint swelling.       Uses Bayer product at times Prednisone helps at times--will get intermittent steroid injection (but not in a year)  Skin:       Groin rash--cleans it well  Allergic/Immunologic: Negative for environmental allergies and immunocompromised state.  Neurological: Negative for dizziness, syncope, light-headedness and headaches.       Chronic right foot drop  Hematological: Negative for adenopathy. Does not bruise/bleed easily.  Psychiatric/Behavioral: Negative for dysphoric mood. The patient is not nervous/anxious.        Objective:   Physical Exam  Constitutional: He is oriented to person, place, and time. He appears well-developed. No distress.  HENT:  Head: Normocephalic and atraumatic.  Right Ear: External ear normal.  Left Ear: External ear normal.  Mouth/Throat: Oropharynx is clear and moist. No oropharyngeal exudate.  Eyes: Pupils are equal, round, and reactive to light. Conjunctivae are normal.  Neck: No thyromegaly present.  Cardiovascular: Normal rate, regular rhythm, normal heart sounds and intact distal pulses. Exam reveals no gallop.  No murmur heard. Respiratory: Effort normal and breath sounds normal. No respiratory distress. He has no wheezes. He has no rales.  GI: Soft. There is no abdominal tenderness.  Musculoskeletal:        General: No tenderness or edema.  Lymphadenopathy:    He has no cervical adenopathy.  Neurological: He is alert and oriented to person, place, and time.  Skin: No rash noted. No erythema.  Psychiatric: He has a normal mood and affect. His behavior is normal.           Assessment & Plan:

## 2018-12-31 IMAGING — MR MR LUMBAR SPINE WO/W CM
4 of 7 series · 26 of 48 positions shown · IV contrast (15cc Multihance)
Comparison: MRI lumbar spine 10/10/2011.

CLINICAL DATA: Low back pain radiating into both legs for 2 months.
No known injury. History of lumbar spine surgery x3.

Creatinine was obtained on site at [HOSPITAL] at [HOSPITAL].
Results: Creatinine 0.9 mg/dL.
EXAM:
MRI LUMBAR SPINE WITHOUT AND WITH CONTRAST
TECHNIQUE: Multiplanar and multiecho pulse sequences of the lumbar spine were
obtained without and with intravenous contrast.
CONTRAST:  20mL MULTIHANCE GADOBENATE DIMEGLUMINE 529 MG/ML IV SOLN

[Series 4: T1 · sagittal · 4.0mm · 0.55mm/px · 5 of 17 slices shown (1 of 2)]
[im 1/17]
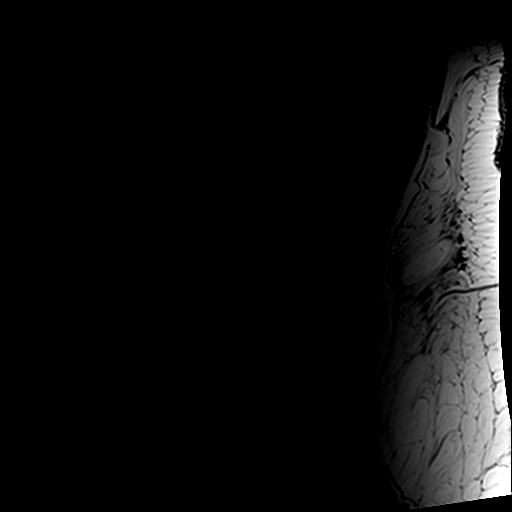
[im 5/17]
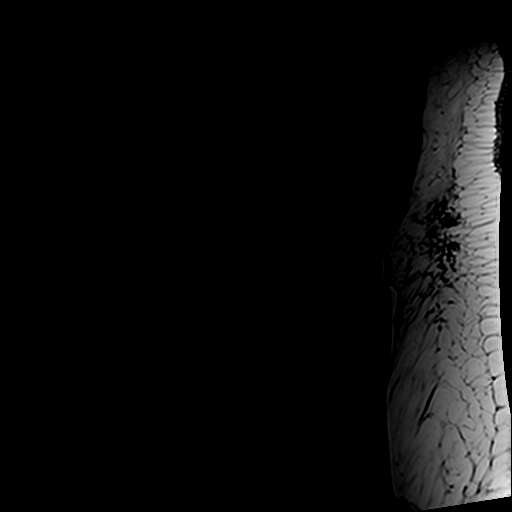
[im 9/17]
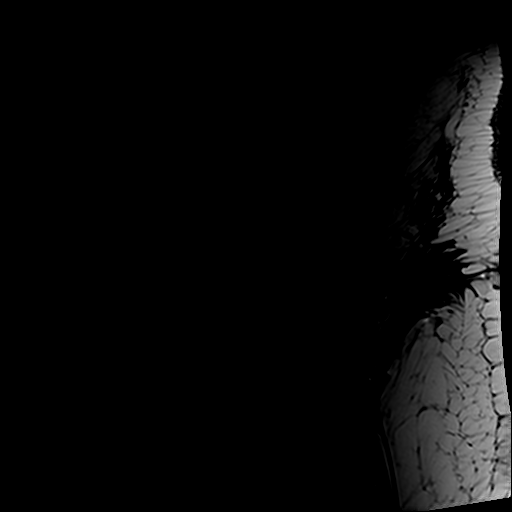
[im 13/17]
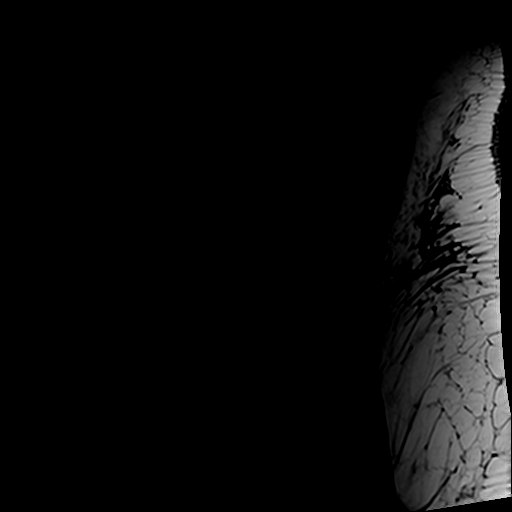
[im 17/17]
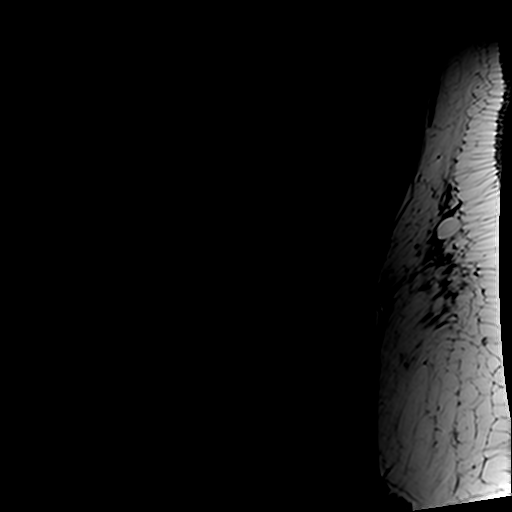

[Series 5: T2 · axial · 4.0mm · 0.70mm/px · z∈[-127,+102]mm · 11 of 43 slices shown (1 of 2)]
[im 1/43]
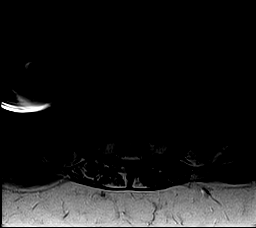
[im 5/43]
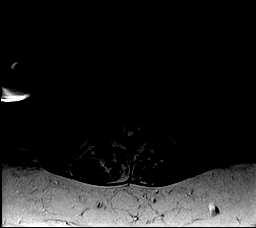
[im 9/43]
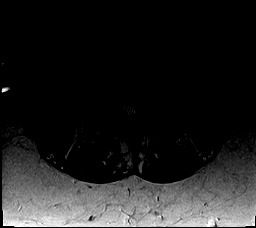
[im 13/43]
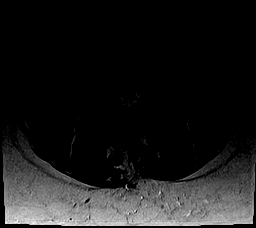
[im 17/43]
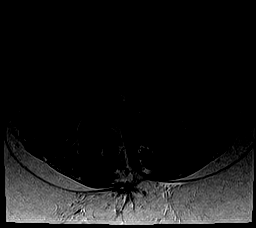
[im 22/43]
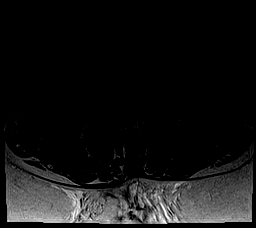
[im 26/43]
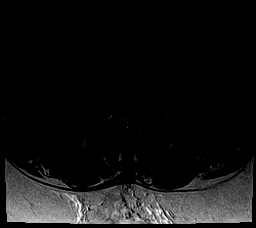
[im 30/43]
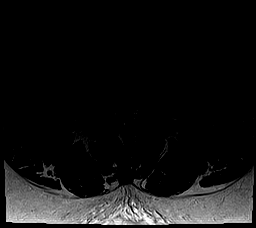
[im 34/43]
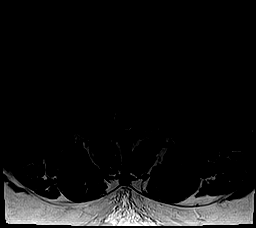
[im 38/43]
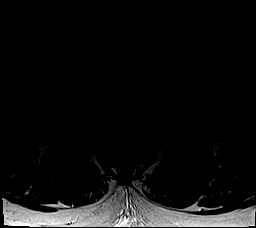
[im 43/43]
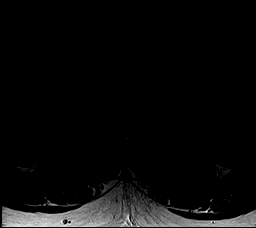

[Series 6: T1 · axial · 4.0mm · 0.35mm/px · z∈[-127,+76]mm · 6 of 43 slices shown (2 of 2)]
[im 1/43]
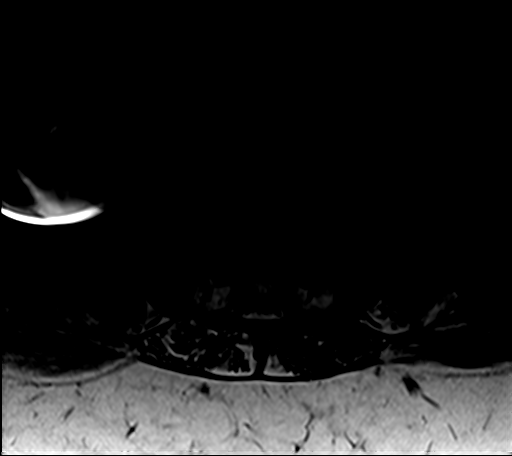
[im 5/43]
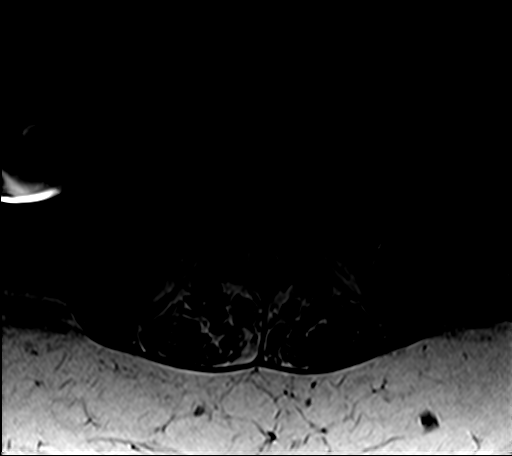
[im 15/43]
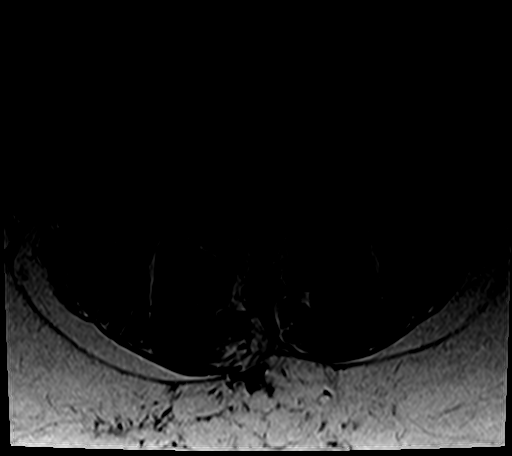
[im 19/43]
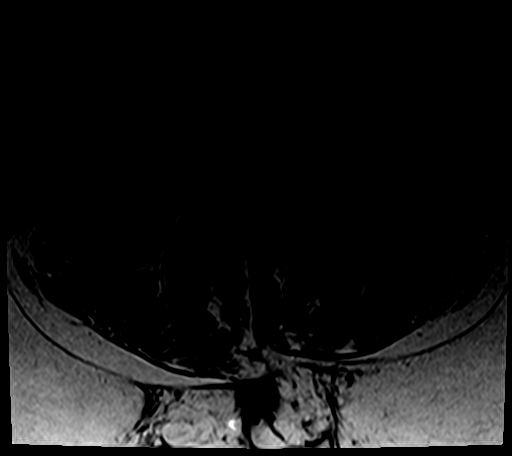
[im 24/43]
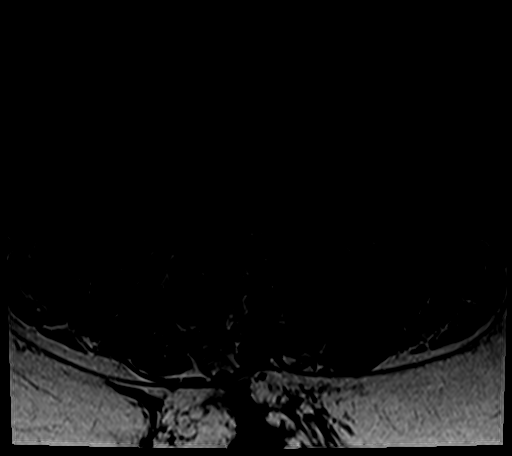
[im 38/43]
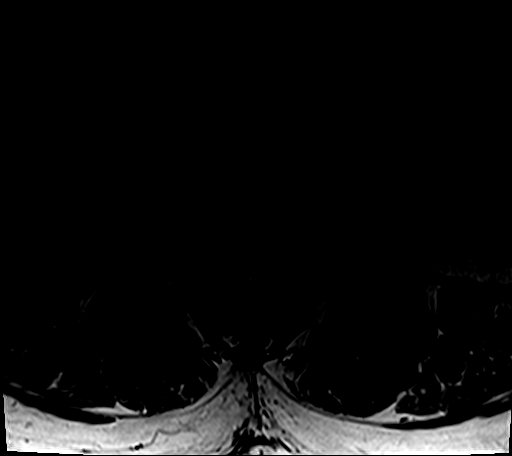

[Series 8: T2 · sagittal · 4.0mm · 0.55mm/px · 4 of 17 slices shown (2 of 2)]
[im 1/17]
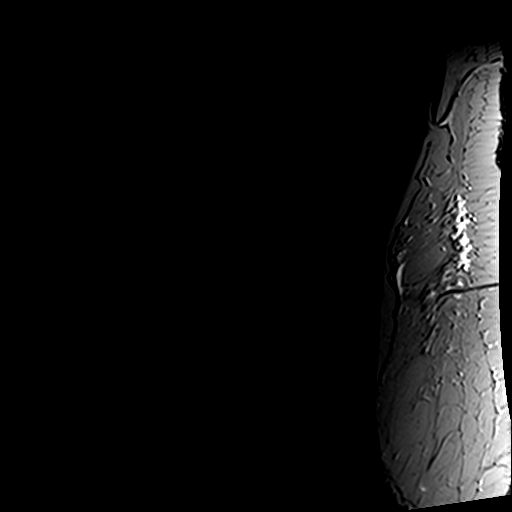
[im 6/17]
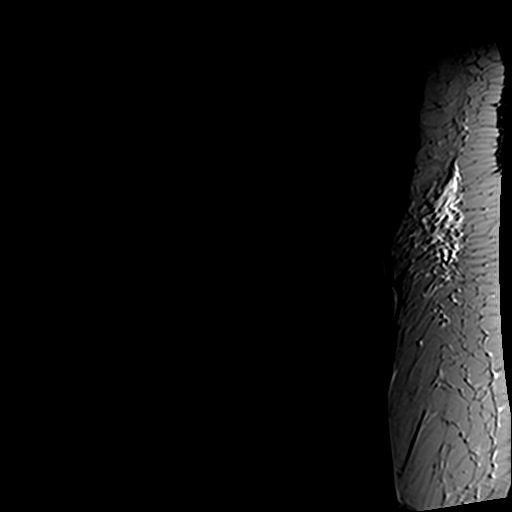
[im 11/17]
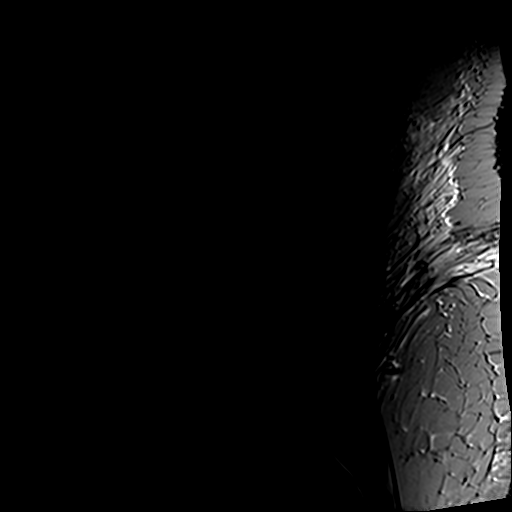
[im 17/17]
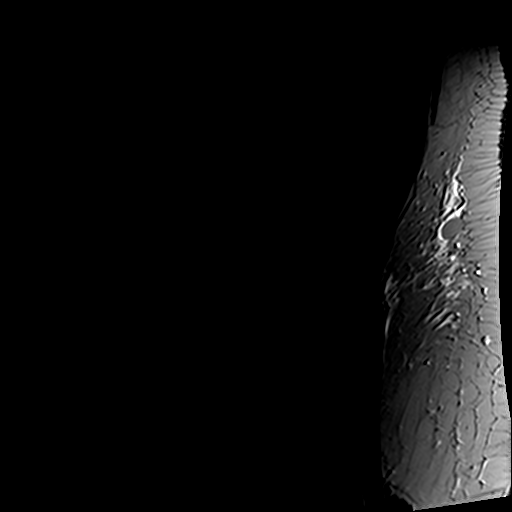

[26 of 48 positions shown; findings below may reference images not displayed]

FINDINGS: Segmentation:  Standard.

Alignment:  Maintained.

Vertebrae: The patient has a congenitally narrow central spinal
canal. New degenerative endplate signal change is present
posteriorly at L3-4 and L5-S1. Degenerative endplate signal change
at L4-5 present on the prior examination appears worse. No fracture.

Conus medullaris and cauda equina: Conus extends to the T12-L1
level. Conus and cauda equina appear normal.

Paraspinal and other soft tissues: Negative.

Disc levels:

T11-12 is imaged in the sagittal plane only. There is some loss of
disc space height and disc desiccation. Small left paracentral
protrusion is identified but the central canal and foramina appear
open.

T12-L1: Negative.

L1-2: Negative.

L2-3: The disc is desiccated with a shallow left paracentral
protrusion which is new since the prior examination. There is
minimal narrowing in the left subarticular recess. The foramina are
open.

L3-4: Right laminotomy defect is identified. Epidural enhancement is
seen at the laminectomy site on the right adjacent to the thecal sac
and appears to have some mass effect on the thecal sac. The central
canal and foramina appear open.

L4-5: Status post right laminotomy. There is a shallow disc bulge
and endplate spur. The central canal is open. Mild bilateral
foraminal narrowing appears unchanged.

L5-S1: Left paracentral and subarticular recess protrusion present
on the prior examination appears decreased in size. The thecal sac
and subarticular recesses are widely patent.
IMPRESSION: Since the prior examination, the patient has developed increased
epidural enhancement at L3-4 on the right at the site of prior
laminectomy which results in some mass effect on the thecal sac.
Finding could be due to synovitis about the right facet joint or a
synovial cyst which has collapsed. Epidural scar is possible but the
appearance is atypical.

New degenerative endplate signal change L3-4 and L5-S1. Degenerative
endplate signal change at L4-5 is worsened since the prior exam.

Decrease in the size of left paracentral protrusion at L5-S1. The
lateral recesses and thecal sac are open at L5-S1. Mild to moderate
narrowing is worse on the left.

New small left paracentral protrusion at L2-3 causes minimal
narrowing in the left subarticular recess.

## 2019-02-01 ENCOUNTER — Other Ambulatory Visit: Payer: Self-pay | Admitting: Internal Medicine

## 2019-02-01 NOTE — Telephone Encounter (Signed)
Last filled 12-04-18 #60 Last OV 9-11.20 Next OV 12-13-19 [place]

## 2019-02-25 DIAGNOSIS — M5136 Other intervertebral disc degeneration, lumbar region: Secondary | ICD-10-CM | POA: Diagnosis not present

## 2019-02-25 DIAGNOSIS — M5416 Radiculopathy, lumbar region: Secondary | ICD-10-CM | POA: Diagnosis not present

## 2019-02-25 DIAGNOSIS — M5126 Other intervertebral disc displacement, lumbar region: Secondary | ICD-10-CM | POA: Diagnosis not present

## 2019-03-27 ENCOUNTER — Other Ambulatory Visit: Payer: Self-pay | Admitting: Internal Medicine

## 2019-03-27 NOTE — Telephone Encounter (Signed)
Last filled 02-01-19 #60 Last OV 12-07-18 Next OV 12-13-19 [place]  Routing to [facility] in Dr Alla German absence

## 2019-04-08 DIAGNOSIS — M5136 Other intervertebral disc degeneration, lumbar region: Secondary | ICD-10-CM | POA: Diagnosis not present

## 2019-04-08 DIAGNOSIS — M5126 Other intervertebral disc displacement, lumbar region: Secondary | ICD-10-CM | POA: Diagnosis not present

## 2019-04-08 DIAGNOSIS — M5416 Radiculopathy, lumbar region: Secondary | ICD-10-CM | POA: Diagnosis not present

## 2019-04-10 ENCOUNTER — Other Ambulatory Visit: Payer: Self-pay | Admitting: Physical Medicine and Rehabilitation

## 2019-04-10 DIAGNOSIS — M5416 Radiculopathy, lumbar region: Secondary | ICD-10-CM

## 2019-04-17 ENCOUNTER — Ambulatory Visit
Admission: RE | Admit: 2019-04-17 | Discharge: 2019-04-17 | Disposition: A | Discharge status: Home / Self Care | Payer: BC Managed Care – PPO | Source: Ambulatory Visit | Attending: Physical Medicine and Rehabilitation | Admitting: Physical Medicine and Rehabilitation

## 2019-04-17 DIAGNOSIS — M5416 Radiculopathy, lumbar region: Secondary | ICD-10-CM

## 2019-04-17 DIAGNOSIS — M545 Low back pain: Secondary | ICD-10-CM | POA: Diagnosis not present

## 2019-04-17 MED ORDER — IOPAMIDOL (ISOVUE-M 200) INJECTION 41%
1.0000 mL | Freq: Once | INTRAMUSCULAR | Status: AC
  Administered 2019-04-17: 1 mL via EPIDURAL

## 2019-04-17 MED ORDER — METHYLPREDNISOLONE ACETATE 40 MG/ML INJ SUSP (RADIOLOG
120.0000 mg | Freq: Once | INTRAMUSCULAR | Status: AC
  Administered 2019-04-17: 10:00:00 120 mg via EPIDURAL

## 2019-04-17 NOTE — Discharge Instructions (Signed)

## 2019-04-29 ENCOUNTER — Other Ambulatory Visit: Payer: Self-pay | Admitting: Internal Medicine

## 2019-04-29 NOTE — Telephone Encounter (Signed)
Last filled 03/27/2019 #60 Last OV 9-011-20 Next OV 12-13-19 [facility]

## 2019-05-06 ENCOUNTER — Telehealth: Payer: Self-pay

## 2019-05-06 DIAGNOSIS — M5442 Lumbago with sciatica, left side: Secondary | ICD-10-CM | POA: Diagnosis not present

## 2019-05-06 DIAGNOSIS — M5126 Other intervertebral disc displacement, lumbar region: Secondary | ICD-10-CM | POA: Diagnosis not present

## 2019-05-06 DIAGNOSIS — M5136 Other intervertebral disc degeneration, lumbar region: Secondary | ICD-10-CM | POA: Diagnosis not present

## 2019-05-06 DIAGNOSIS — M5416 Radiculopathy, lumbar region: Secondary | ICD-10-CM | POA: Diagnosis not present

## 2019-05-06 HISTORY — DX: Other intervertebral disc displacement, lumbar region: M51.26

## 2019-05-06 MED ORDER — AMLODIPINE BESYLATE 5 MG PO TABS: ORAL_TABLET | ORAL | 3 refills | Status: DC

## 2019-05-06 MED ORDER — LOSARTAN POTASSIUM 100 MG PO TABS: ORAL_TABLET | ORAL | 3 refills | Status: DC

## 2019-05-06 NOTE — Telephone Encounter (Signed)
I feel we can defer visit at this time due to no symptoms and he saw a physician today. Will see how he does with the amlodipine. Please check on him in the next day or so and see how he is doing

## 2019-05-06 NOTE — Telephone Encounter (Signed)
Spoke to pt. Advised him I will check on him Wednesday.

## 2019-05-06 NOTE — Telephone Encounter (Signed)
Dr Yves Dill with [facility] left v/m that he had seen pt today due to lower back pain and disc protrusion; pt was referred to neuro surgeon.  Today BP 198/108 and reck 162/103 P 74; No H/A,CP,SOB or other symptoms. Pt cks BP at home and diastolic is consistently 100. Dr Yves Dill request any changes needed to get BP under better control to let pt know. Pt is on BP meds; pt taking Hygroton 25 mg daily and losartan 100 mg taking one daily; pt said some time back BP meds were reduced due to lower BP but now pt has a stressful job and pt thinks may need to increase BP meds again.things have been more stressful last 30 days. Pt took hygroton and losartan at 7:30 AM today; due to BP being high pt took 2nd dose of losartan and hygroton at 11 AM.  Contact pt with any changes. Last annual exam on 12/07/18. Pt has no covid symptoms; pt does have joint soreness which is no change of joint pain than usual, no travel and no known exposure to + covid. BP now at home is 152/122 and waited few mins and recked and BP 150/118  P 82. I spoke with Dr Alphonsus Sias; pt is to take the Hygroton 25 mg po in the AM and adding Amlodipine 5 mg po in AM. Pt is to also take losartan 100 mg at night. Pt will monitor BP at home with a log and if feels OK monitor at home and  and pt scheduled 1 mth fu on 06/03/19 at 4 PM with Dr Alphonsus Sias in office.  Pt has any symptoms or concerns will call [facility]. UC & ED precautions given and pt voiced understanding. Amlodipine 5 mg taking 1 tab po in AM # 90 x 3 was sent electronically to [facility] pharmacy and pt will start new BP regimen on 05/07/19; pt will not take losartan tonight since has already had 2 doses today. Med list updated as instructed. FYI to Dr Alphonsus Sias.

## 2019-05-20 NOTE — Telephone Encounter (Signed)
Spoke to pt. He said in the am and in the evenings. During the day it elevates to 150. He said he is feeling fine. Taking fish oil and B Complex. Thinks his diet has a lot to do with it. Working on that.

## 2019-05-24 ENCOUNTER — Other Ambulatory Visit: Payer: Self-pay | Admitting: Physical Medicine and Rehabilitation

## 2019-05-24 DIAGNOSIS — M5442 Lumbago with sciatica, left side: Secondary | ICD-10-CM

## 2019-05-24 DIAGNOSIS — M5126 Other intervertebral disc displacement, lumbar region: Secondary | ICD-10-CM

## 2019-06-03 ENCOUNTER — Other Ambulatory Visit: Payer: Self-pay

## 2019-06-03 ENCOUNTER — Ambulatory Visit (INDEPENDENT_AMBULATORY_CARE_PROVIDER_SITE_OTHER): Payer: BC Managed Care – PPO | Admitting: Internal Medicine

## 2019-06-03 ENCOUNTER — Other Ambulatory Visit: Payer: Self-pay | Admitting: Internal Medicine

## 2019-06-03 ENCOUNTER — Encounter: Payer: Self-pay | Admitting: Internal Medicine

## 2019-06-03 DIAGNOSIS — F17210 Nicotine dependence, cigarettes, uncomplicated: Secondary | ICD-10-CM

## 2019-06-03 DIAGNOSIS — I10 Essential (primary) hypertension: Secondary | ICD-10-CM

## 2019-06-03 MED ORDER — METOPROLOL SUCCINATE ER 50 MG PO TB24: 50.0000 mg | ORAL_TABLET | Freq: Every day | ORAL | 3 refills | Status: DC

## 2019-06-03 NOTE — Telephone Encounter (Signed)
Last filled 04-29-19 #60 Last OV 12-07-18 Next OV this afternoon [facility]

## 2019-06-03 NOTE — Assessment & Plan Note (Signed)
Didn't do well with chantix Discussed patches and gum/lozenges

## 2019-06-03 NOTE — Progress Notes (Signed)
Subjective:    Patient ID: Juan Franklin, male    DOB: 09-03-1975, 44 y.o.   MRN: [id number]  HPI Here due to elevated blood pressure This visit occurred during the SARS-CoV-2 public health emergency.  Safety protocols were in place, including screening questions prior to the visit, additional usage of staff PPE, and extensive cleaning of exam room while observing appropriate contact time as indicated for disinfecting solutions.   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 Some headaches No chest pain Gets palpitations at times  Current Outpatient Medications on File Prior to Visit  Medication Sig Dispense Refill  . amLODipine (NORVASC) 5 MG tablet Take one tablet by mouth in AM. 90 tablet 3  . chlorthalidone (HYGROTON) 25 MG tablet TAKE 1 TABLET BY MOUTH ONCE DAILY (Patient taking differently: Take one tablet by mouth in AM.) 90 tablet 3  . clonazePAM (KLONOPIN) 0.5 MG tablet TAKE 1 TABLET BY MOUTH TWICE A DAY AS NEEDED 60 tablet 0  . gabapentin (NEURONTIN) 300 MG capsule 1 po q AM and 2 qHS    . losartan (COZAAR) 100 MG tablet Take one tablet by mouth at night. 90 tablet 3  . montelukast (SINGULAIR) 10 MG tablet TAKE 1 TABLET BY MOUTH AT BEDTIME 90 tablet 3  . nystatin-triamcinolone (MYCOLOG II) cream Apply 1 application topically 2 (two) times daily as needed.     No current facility-administered medications on file prior to visit.    Allergies  Allergen Reactions  . Hydrocodone-Homatropine Itching    Has tolerated vicodin in the past  . Lisinopril Cough    Past Medical History:  Diagnosis Date  . Allergic rhinitis   . Anxiety   . GERD  (gastroesophageal reflux disease)   . Hypertension   . Obstructive sleep apnea     Past Surgical History:  Procedure Laterality Date  . LUMBAR DISC SURGERY  5/10   Dr. Franky Macho  . LUMBAR DISC SURGERY  1/12   L4-5  . NASAL SINUS SURGERY  10/10   Dr. Willeen Cass    Family History  Problem Relation Age of Onset  . Cancer Mother        Breast    Social History   Socioeconomic History  . Marital status: Divorced    Spouse name: Not on file  . Number of children: 1  . Years of education: Not on file  . Highest education level: Not on file  Occupational History  . Occupation: Quality control    Comment: [employer]  Tobacco Use  . Smoking status: Current Every Day Smoker    Packs/day: 1.00    Years: 17.00    Pack years: 17.00    Types: Cigarettes  . Smokeless tobacco: Never Used  Substance and Sexual Activity  . Alcohol use: No    Alcohol/week: 0.0 standard drinks    Comment: Quit after 3 DUI's.  Now drinks 6-8 at night when he doesn't have his daughter.  . Drug use: Not on file  . Sexual activity: Not on file  Other Topics Concern  . Not on file  Social History Narrative       Social Determinants of Health  Financial Resource Strain:   . Difficulty of Paying Living Expenses: Not on file  Food Insecurity:   . Worried About Charity fundraiser in the Last Year: Not on file  . Ran Out of Food in the Last Year: Not on file  Transportation Needs:   . Lack of Transportation (Medical): Not on file  . Lack of Transportation (Non-Medical): Not on file  Physical Activity:   . Days of Exercise per Week: Not on file  . Minutes of Exercise per Session: Not on file  Stress:   . Feeling of Stress : Not on file  Social Connections:   . Frequency of Communication with Friends and Family: Not on file  . Frequency of Social Gatherings with Friends and Family: Not on file  . Attends Religious Services: Not on file  . Active Member of Clubs or Organizations: Not on  file  . Attends Archivist Meetings: Not on file  . Marital Status: Not on file  Intimate Partner Violence:   . Fear of Current or Ex-Partner: Not on file  . Emotionally Abused: Not on file  . Physically Abused: Not on file  . Sexually Abused: Not on file   Review of Systems Mild edema---stable Eating okay Doing keto diet--weight down 20# from his personal high last year Sleeps okay Still smoking--discussed Lumbar disc issues again---has MRI pending    Objective:   Physical Exam  Constitutional: He appears well-developed. No distress.  Neck: No thyromegaly present.  Cardiovascular: Normal rate, regular rhythm and normal heart sounds. Exam reveals no gallop.  No murmur heard. Respiratory: Effort normal and breath sounds normal. No respiratory distress. He has no wheezes. He has no rales.  Musculoskeletal:     Comments: Thick calves but no pitting edema  Lymphadenopathy:    He has no cervical adenopathy.           Assessment & Plan:

## 2019-06-03 NOTE — Assessment & Plan Note (Signed)
BP Readings from Last 3 Encounters:  06/03/19 (!) 151/77  04/17/19 (!) 161/102  12/07/18 134/80   Worse now Will add metoprolol See back in ~1 month

## 2019-06-10 ENCOUNTER — Other Ambulatory Visit: Payer: BC Managed Care – PPO

## 2019-06-10 ENCOUNTER — Ambulatory Visit
Admission: RE | Admit: 2019-06-10 | Discharge: 2019-06-10 | Disposition: A | Discharge status: Home / Self Care | Payer: BC Managed Care – PPO | Source: Ambulatory Visit | Attending: Physical Medicine and Rehabilitation | Admitting: Physical Medicine and Rehabilitation

## 2019-06-10 ENCOUNTER — Other Ambulatory Visit: Payer: Self-pay

## 2019-06-10 DIAGNOSIS — M48061 Spinal stenosis, lumbar region without neurogenic claudication: Secondary | ICD-10-CM | POA: Diagnosis not present

## 2019-06-10 DIAGNOSIS — M5126 Other intervertebral disc displacement, lumbar region: Secondary | ICD-10-CM

## 2019-06-10 DIAGNOSIS — M5442 Lumbago with sciatica, left side: Secondary | ICD-10-CM

## 2019-06-17 DIAGNOSIS — M5126 Other intervertebral disc displacement, lumbar region: Secondary | ICD-10-CM | POA: Diagnosis not present

## 2019-06-18 ENCOUNTER — Other Ambulatory Visit: Payer: Self-pay | Admitting: Neurosurgery

## 2019-06-24 NOTE — Progress Notes (Signed)
Juan Franklin. Juan Franklin  Your procedure is scheduled on Thursday April 1.  Report to [facility] Main Entrance "A" at 10:40 A.M., and check in at the Admitting office.  Call this number if you have problems the morning of surgery: [phone]  Call [phone] if you have any questions prior to your surgery date Monday-Friday 8am-4pm   Remember: Do not eat or drink after midnight the night before your surgery    Take these medicines the morning of surgery with A SIP OF WATER: amLODipine (NORVASC)  clonazePAM (KLONOPIN) metoprolol succinate (TOPROL-XL)   As of today, STOP taking any Aspirin (unless otherwise instructed by your surgeon), Aleve, Naproxen, Ibuprofen, Motrin, Advil, Goody's, BC's, all herbal medications, fish oil, and all vitamins.    The Morning of Surgery  Do not wear jewelry.  Do not wear lotions, powders, colognes, or deodorant Men may shave face and neck.  Do not bring valuables to the hospital.  [facility] is not responsible for any belongings or valuables.  If you are a smoker, DO NOT Smoke 24 hours prior to surgery  If you wear a CPAP at night please bring your mask the morning of surgery   Remember that you must have someone to transport you home after your surgery, and remain with you for 24 hours if you are discharged the same day.   Please bring cases for contacts, glasses, hearing aids, dentures or bridgework because it cannot be worn into surgery.    Leave your suitcase in the car.  After surgery it may be brought to your room.  For patients admitted to the hospital, discharge time will be determined by your treatment team.  Patients discharged the day of surgery will not be allowed to drive home.    Special instructions:   [place]- Preparing For Surgery  Before surgery, you can play an important role. Because skin is not sterile, your skin needs to be as free of germs as possible. You can reduce the number of germs on your skin by washing  with CHG (chlorahexidine gluconate) Soap before surgery.  CHG is an antiseptic cleaner which kills germs and bonds with the skin to continue killing germs even after washing.    Oral Hygiene is also important to reduce your risk of infection.  Remember - BRUSH YOUR TEETH THE MORNING OF SURGERY WITH YOUR REGULAR TOOTHPASTE  Please do not use if you have an allergy to CHG or antibacterial soaps. If your skin becomes reddened/irritated stop using the CHG.  Do not shave (including legs and underarms) for at least 48 hours prior to first CHG shower. It is OK to shave your face.  Please follow these instructions carefully.   1. Shower the NIGHT BEFORE SURGERY and the MORNING OF SURGERY with CHG Soap.   2. If you chose to wash your hair and body, wash as usual with your normal shampoo and body-wash/soap.  3. Rinse your hair and body thoroughly to remove the shampoo and soap.  4. Apply CHG directly to the skin (ONLY FROM THE NECK DOWN) and wash gently with a scrungie or a clean washcloth.   5. Do not use on open wounds or open sores. Avoid contact with your eyes, ears, mouth and genitals (private parts). Wash Face and genitals (private parts)  with your normal soap.   6. Wash thoroughly, paying special attention to the area where your surgery will be performed.  7. Thoroughly rinse your body with warm water from the neck down.  8. DO NOT shower/wash with your normal soap after using and rinsing off the CHG Soap.  9. Pat yourself dry with a CLEAN TOWEL.  10. Wear CLEAN PAJAMAS to bed the night before surgery  11. Place CLEAN SHEETS on your bed the night of your first shower and DO NOT SLEEP WITH PETS.  12. Wear comfortable clothes the morning of surgery.     Day of Surgery:  Please shower the morning of surgery with the CHG soap Do not apply any deodorants/lotions. Please wear clean clothes to the hospital/surgery center.   Remember to brush your teeth WITH YOUR REGULAR  TOOTHPASTE.   Please read over the following fact sheets that you were given.

## 2019-06-25 ENCOUNTER — Other Ambulatory Visit (HOSPITAL_COMMUNITY)
Admission: RE | Admit: 2019-06-25 | Discharge: 2019-06-25 | Disposition: A | Discharge status: Home / Self Care | Payer: BC Managed Care – PPO | Source: Ambulatory Visit | Attending: Neurosurgery | Admitting: Neurosurgery

## 2019-06-25 ENCOUNTER — Encounter (HOSPITAL_COMMUNITY): Payer: Self-pay

## 2019-06-25 ENCOUNTER — Encounter (HOSPITAL_COMMUNITY)
Admission: RE | Admit: 2019-06-25 | Discharge: 2019-06-25 | Disposition: A | Discharge status: Home / Self Care | Payer: BC Managed Care – PPO | Source: Ambulatory Visit | Attending: Neurosurgery | Admitting: Neurosurgery

## 2019-06-25 ENCOUNTER — Other Ambulatory Visit: Payer: Self-pay

## 2019-06-25 DIAGNOSIS — Z20822 Contact with and (suspected) exposure to covid-19: Secondary | ICD-10-CM | POA: Insufficient documentation

## 2019-06-25 DIAGNOSIS — Z01818 Encounter for other preprocedural examination: Secondary | ICD-10-CM | POA: Diagnosis not present

## 2019-06-25 LAB — CBC
HCT: 46.9 % (ref 39.0–52.0)
Hemoglobin: 15.6 g/dL (ref 13.0–17.0)
MCH: 32.4 pg (ref 26.0–34.0)
MCHC: 33.3 g/dL (ref 30.0–36.0)
MCV: 97.5 fL (ref 80.0–100.0)
Platelets: 280 10*3/uL (ref 150–400)
RBC: 4.81 MIL/uL (ref 4.22–5.81)
RDW: 12.8 % (ref 11.5–15.5)
WBC: 10.3 10*3/uL (ref 4.0–10.5)
nRBC: 0 % (ref 0.0–0.2)

## 2019-06-25 LAB — COMPREHENSIVE METABOLIC PANEL
ALT: 50 U/L — ABNORMAL HIGH (ref 0–44)
AST: 37 U/L (ref 15–41)
Albumin: 4.2 g/dL (ref 3.5–5.0)
Alkaline Phosphatase: 96 U/L (ref 38–126)
Anion gap: 12 (ref 5–15)
BUN: 14 mg/dL (ref 6–20)
CO2: 27 mmol/L (ref 22–32)
Calcium: 8.9 mg/dL (ref 8.9–10.3)
Chloride: 99 mmol/L (ref 98–111)
Creatinine, Ser: 0.77 mg/dL (ref 0.61–1.24)
GFR calc Af Amer: >60 mL/min (ref >60)
GFR calc non Af Amer: >60 mL/min (ref >60)
Glucose, Bld: 109 mg/dL — ABNORMAL HIGH (ref 70–99)
Potassium: 3.7 mmol/L (ref 3.5–5.1)
Sodium: 138 mmol/L (ref 135–145)
Total Bilirubin: 0.8 mg/dL (ref 0.3–1.2)
Total Protein: 7 g/dL (ref 6.5–8.1)

## 2019-06-25 LAB — SURGICAL PCR SCREEN
MRSA, PCR: NEGATIVE
Staphylococcus aureus: NEGATIVE

## 2019-06-25 LAB — SARS CORONAVIRUS 2 (TAT 6-24 HRS): SARS Coronavirus 2: NEGATIVE

## 2019-06-25 NOTE — Progress Notes (Signed)
PCP - Tillman Abide, MD Cardiologist - pt denies   Chest x-ray - n/a EKG - 06/25/19  Stress Test - pt denies ECHO - pt denies Cardiac Cath - pt denies  CPAP - yes   COVID TEST- 06/25/19   Coronavirus Screening  Have you experienced the following symptoms:  Cough yes/no: No Fever (>100.92F)  yes/no: No Runny nose yes/no: No Sore throat yes/no: No Difficulty breathing/shortness of breath  yes/no: No  Have you or a family member traveled in the last 14 days and where? yes/no: No   If the patient indicates "YES" to the above questions, their PAT will be rescheduled to limit the exposure to others and, the surgeon will be notified. THE PATIENT WILL NEED TO BE ASYMPTOMATIC FOR 14 DAYS.   If the patient is not experiencing any of these symptoms, the PAT nurse will instruct them to NOT bring anyone with them to their appointment since they may have these symptoms or traveled as well.   Please remind your patients and families that hospital visitation restrictions are in effect and the importance of the restrictions.     Anesthesia review: n/a  Patient denies shortness of breath, fever, cough and chest pain at PAT appointment   All instructions explained to the patient, with a verbal understanding of the material. Patient agrees to go over the instructions while at home for a better understanding. Patient also instructed to self quarantine after being tested for COVID-19. The opportunity to ask questions was provided.

## 2019-06-26 MED ORDER — DEXTROSE 5 % IV SOLN
3.0000 g | INTRAVENOUS | Status: AC
  Administered 2019-06-27: 3 g via INTRAVENOUS
  Filled 2019-06-26: qty 3000
  Filled 2019-06-26: qty 3

## 2019-06-27 ENCOUNTER — Encounter (HOSPITAL_COMMUNITY): Payer: Self-pay | Admitting: Neurosurgery

## 2019-06-27 ENCOUNTER — Ambulatory Visit (HOSPITAL_COMMUNITY)
Admission: RE | Admit: 2019-06-27 | Discharge: 2019-06-27 | Disposition: A | Discharge status: Home / Self Care | Payer: BC Managed Care – PPO | Source: Ambulatory Visit | Attending: Neurosurgery | Admitting: Neurosurgery

## 2019-06-27 ENCOUNTER — Other Ambulatory Visit: Payer: Self-pay

## 2019-06-27 ENCOUNTER — Ambulatory Visit (HOSPITAL_COMMUNITY): Payer: BC Managed Care – PPO | Admitting: Certified Registered Nurse Anesthetist

## 2019-06-27 ENCOUNTER — Encounter (HOSPITAL_COMMUNITY)
Admission: RE | Disposition: A | Discharge status: Home / Self Care | Payer: Self-pay | Source: Ambulatory Visit | Attending: Neurosurgery

## 2019-06-27 ENCOUNTER — Ambulatory Visit (HOSPITAL_COMMUNITY): Payer: BC Managed Care – PPO

## 2019-06-27 DIAGNOSIS — Z79899 Other long term (current) drug therapy: Secondary | ICD-10-CM | POA: Insufficient documentation

## 2019-06-27 DIAGNOSIS — F419 Anxiety disorder, unspecified: Secondary | ICD-10-CM | POA: Insufficient documentation

## 2019-06-27 DIAGNOSIS — M5126 Other intervertebral disc displacement, lumbar region: Secondary | ICD-10-CM | POA: Insufficient documentation

## 2019-06-27 DIAGNOSIS — I10 Essential (primary) hypertension: Secondary | ICD-10-CM | POA: Diagnosis not present

## 2019-06-27 DIAGNOSIS — F17210 Nicotine dependence, cigarettes, uncomplicated: Secondary | ICD-10-CM | POA: Insufficient documentation

## 2019-06-27 DIAGNOSIS — M5127 Other intervertebral disc displacement, lumbosacral region: Secondary | ICD-10-CM | POA: Diagnosis not present

## 2019-06-27 DIAGNOSIS — J449 Chronic obstructive pulmonary disease, unspecified: Secondary | ICD-10-CM | POA: Insufficient documentation

## 2019-06-27 DIAGNOSIS — Z419 Encounter for procedure for purposes other than remedying health state, unspecified: Secondary | ICD-10-CM

## 2019-06-27 DIAGNOSIS — K760 Fatty (change of) liver, not elsewhere classified: Secondary | ICD-10-CM | POA: Diagnosis not present

## 2019-06-27 DIAGNOSIS — Z6841 Body Mass Index (BMI) 40.0 and over, adult: Secondary | ICD-10-CM | POA: Diagnosis not present

## 2019-06-27 DIAGNOSIS — K219 Gastro-esophageal reflux disease without esophagitis: Secondary | ICD-10-CM | POA: Diagnosis not present

## 2019-06-27 DIAGNOSIS — Z981 Arthrodesis status: Secondary | ICD-10-CM | POA: Diagnosis not present

## 2019-06-27 DIAGNOSIS — G4733 Obstructive sleep apnea (adult) (pediatric): Secondary | ICD-10-CM | POA: Diagnosis not present

## 2019-06-27 HISTORY — PX: LUMBAR LAMINECTOMY/DECOMPRESSION MICRODISCECTOMY: SHX5026

## 2019-06-27 SURGERY — LUMBAR LAMINECTOMY/DECOMPRESSION MICRODISCECTOMY 1 LEVEL
Anesthesia: General | Site: Spine Lumbar | Laterality: Left

## 2019-06-27 MED ORDER — EPHEDRINE SULFATE 50 MG/ML IJ SOLN
INTRAMUSCULAR | Status: DC | PRN
  Administered 2019-06-27 (×2): 5 mg via INTRAVENOUS

## 2019-06-27 MED ORDER — ONDANSETRON HCL 4 MG/2ML IJ SOLN
INTRAMUSCULAR | Status: AC
  Filled 2019-06-27: qty 2

## 2019-06-27 MED ORDER — LACTATED RINGERS IV SOLN: INTRAVENOUS | Status: DC

## 2019-06-27 MED ORDER — THROMBIN 5000 UNITS EX SOLR
CUTANEOUS | Status: DC | PRN
  Administered 2019-06-27 (×2): 5000 [IU] via TOPICAL

## 2019-06-27 MED ORDER — HYDROMORPHONE HCL 1 MG/ML IJ SOLN
INTRAMUSCULAR | Status: AC
  Filled 2019-06-27: qty 1

## 2019-06-27 MED ORDER — LIDOCAINE HCL (CARDIAC) PF 100 MG/5ML IV SOSY
PREFILLED_SYRINGE | INTRAVENOUS | Status: DC | PRN
  Administered 2019-06-27: 40 mg via INTRAVENOUS

## 2019-06-27 MED ORDER — HYDROMORPHONE HCL 1 MG/ML IJ SOLN
0.2500 mg | INTRAMUSCULAR | Status: DC | PRN
  Administered 2019-06-27 (×2): 0.5 mg via INTRAVENOUS

## 2019-06-27 MED ORDER — DEXMEDETOMIDINE HCL IN NACL 200 MCG/50ML IV SOLN
INTRAVENOUS | Status: AC
  Filled 2019-06-27: qty 50

## 2019-06-27 MED ORDER — MEPERIDINE HCL 25 MG/ML IJ SOLN: 6.2500 mg | INTRAMUSCULAR | Status: DC | PRN

## 2019-06-27 MED ORDER — CHLORHEXIDINE GLUCONATE CLOTH 2 % EX PADS: 6.0000 | MEDICATED_PAD | Freq: Once | CUTANEOUS | Status: DC

## 2019-06-27 MED ORDER — ONDANSETRON HCL 4 MG/2ML IJ SOLN
INTRAMUSCULAR | Status: DC | PRN
  Administered 2019-06-27: 4 mg via INTRAVENOUS

## 2019-06-27 MED ORDER — FENTANYL CITRATE (PF) 250 MCG/5ML IJ SOLN
INTRAMUSCULAR | Status: AC
  Filled 2019-06-27: qty 5

## 2019-06-27 MED ORDER — PHENYLEPHRINE HCL (PRESSORS) 10 MG/ML IV SOLN
INTRAVENOUS | Status: DC | PRN
  Administered 2019-06-27: 80 ug via INTRAVENOUS

## 2019-06-27 MED ORDER — SUGAMMADEX SODIUM 200 MG/2ML IV SOLN
INTRAVENOUS | Status: DC | PRN
  Administered 2019-06-27: 500 mg via INTRAVENOUS

## 2019-06-27 MED ORDER — LIDOCAINE HCL (CARDIAC) PF 100 MG/5ML IV SOSY: PREFILLED_SYRINGE | INTRAVENOUS | Status: DC | PRN

## 2019-06-27 MED ORDER — LIDOCAINE-EPINEPHRINE 0.5 %-1:200000 IJ SOLN
INTRAMUSCULAR | Status: DC | PRN
  Administered 2019-06-27: 10 mL

## 2019-06-27 MED ORDER — MIDAZOLAM HCL 2 MG/2ML IJ SOLN
INTRAMUSCULAR | Status: AC
  Filled 2019-06-27: qty 2

## 2019-06-27 MED ORDER — KETOROLAC TROMETHAMINE 30 MG/ML IJ SOLN
INTRAMUSCULAR | Status: AC
  Filled 2019-06-27: qty 1

## 2019-06-27 MED ORDER — MIDAZOLAM HCL 2 MG/2ML IJ SOLN: 0.5000 mg | Freq: Once | INTRAMUSCULAR | Status: DC | PRN

## 2019-06-27 MED ORDER — METHYLPREDNISOLONE ACETATE 80 MG/ML IJ SUSP
INTRAMUSCULAR | Status: AC
  Filled 2019-06-27: qty 1

## 2019-06-27 MED ORDER — PROMETHAZINE HCL 25 MG/ML IJ SOLN: 6.2500 mg | INTRAMUSCULAR | Status: DC | PRN

## 2019-06-27 MED ORDER — ROCURONIUM BROMIDE 100 MG/10ML IV SOLN
INTRAVENOUS | Status: DC | PRN
  Administered 2019-06-27: 30 mg via INTRAVENOUS
  Administered 2019-06-27: 70 mg via INTRAVENOUS

## 2019-06-27 MED ORDER — DEXAMETHASONE SODIUM PHOSPHATE 10 MG/ML IJ SOLN
INTRAMUSCULAR | Status: DC | PRN
  Administered 2019-06-27: 10 mg via INTRAVENOUS

## 2019-06-27 MED ORDER — METHYLPREDNISOLONE ACETATE 80 MG/ML IJ SUSP
INTRAMUSCULAR | Status: DC | PRN
  Administered 2019-06-27: 80 mg

## 2019-06-27 MED ORDER — LIDOCAINE-EPINEPHRINE 0.5 %-1:200000 IJ SOLN
INTRAMUSCULAR | Status: AC
  Filled 2019-06-27: qty 1

## 2019-06-27 MED ORDER — DEXMEDETOMIDINE HCL IN NACL 200 MCG/50ML IV SOLN
INTRAVENOUS | Status: DC | PRN
  Administered 2019-06-27: .4 ug/kg/h via INTRAVENOUS

## 2019-06-27 MED ORDER — HEMOSTATIC AGENTS (NO CHARGE) OPTIME
TOPICAL | Status: DC | PRN
  Administered 2019-06-27: 1 via TOPICAL

## 2019-06-27 MED ORDER — THROMBIN 5000 UNITS EX SOLR
CUTANEOUS | Status: AC
  Filled 2019-06-27: qty 10000

## 2019-06-27 MED ORDER — FENTANYL CITRATE (PF) 100 MCG/2ML IJ SOLN
INTRAMUSCULAR | Status: AC
  Filled 2019-06-27: qty 2

## 2019-06-27 MED ORDER — DEXAMETHASONE SODIUM PHOSPHATE 10 MG/ML IJ SOLN
INTRAMUSCULAR | Status: AC
  Filled 2019-06-27: qty 1

## 2019-06-27 MED ORDER — FENTANYL CITRATE (PF) 100 MCG/2ML IJ SOLN
INTRAMUSCULAR | Status: DC | PRN
  Administered 2019-06-27: 50 ug via INTRAVENOUS
  Administered 2019-06-27: 150 ug via INTRAVENOUS

## 2019-06-27 MED ORDER — KETOROLAC TROMETHAMINE 30 MG/ML IJ SOLN
30.0000 mg | Freq: Once | INTRAMUSCULAR | Status: AC
  Administered 2019-06-27: 30 mg via INTRAVENOUS

## 2019-06-27 MED ORDER — FENTANYL CITRATE (PF) 100 MCG/2ML IJ SOLN: INTRAMUSCULAR | Status: DC | PRN

## 2019-06-27 MED ORDER — MIDAZOLAM HCL 5 MG/5ML IJ SOLN
INTRAMUSCULAR | Status: DC | PRN
  Administered 2019-06-27: 2 mg via INTRAVENOUS

## 2019-06-27 MED ORDER — 0.9 % SODIUM CHLORIDE (POUR BTL) OPTIME
TOPICAL | Status: DC | PRN
  Administered 2019-06-27: 1000 mL

## 2019-06-27 MED ORDER — FENTANYL CITRATE (PF) 100 MCG/2ML IJ SOLN
INTRAMUSCULAR | Status: DC | PRN
  Administered 2019-06-27: 100 ug via INTRAVENOUS

## 2019-06-27 MED ORDER — PROPOFOL 10 MG/ML IV BOLUS
INTRAVENOUS | Status: DC | PRN
  Administered 2019-06-27: 100 mg via INTRAVENOUS
  Administered 2019-06-27: 200 mg via INTRAVENOUS

## 2019-06-27 MED ORDER — ACETAMINOPHEN 500 MG PO TABS
1000.0000 mg | ORAL_TABLET | Freq: Once | ORAL | Status: AC
  Administered 2019-06-27: 1000 mg via ORAL
  Filled 2019-06-27: qty 2

## 2019-06-27 MED ORDER — LIDOCAINE 2% (20 MG/ML) 5 ML SYRINGE
INTRAMUSCULAR | Status: AC
  Filled 2019-06-27: qty 5

## 2019-06-27 MED ORDER — PROPOFOL 10 MG/ML IV BOLUS
INTRAVENOUS | Status: AC
  Filled 2019-06-27: qty 20

## 2019-06-27 SURGICAL SUPPLY — 59 items
ADH SKN CLS APL DERMABOND .7 (GAUZE/BANDAGES/DRESSINGS) ×1
APL SKNCLS STERI-STRIP NONHPOA (GAUZE/BANDAGES/DRESSINGS)
BENZOIN TINCTURE PRP APPL 2/3 (GAUZE/BANDAGES/DRESSINGS) IMPLANT
BLADE CLIPPER SURG (BLADE) IMPLANT
BUR MATCHSTICK NEURO 3.0 LAGG (BURR) ×3 IMPLANT
BUR PRECISION FLUTE 5.0 (BURR) ×2 IMPLANT
CANISTER SUCT 3000ML PPV (MISCELLANEOUS) ×3 IMPLANT
CARTRIDGE OIL MAESTRO DRILL (MISCELLANEOUS) ×1 IMPLANT
CLOSURE WOUND 1/2 X4 (GAUZE/BANDAGES/DRESSINGS)
COVER WAND RF STERILE (DRAPES) ×1 IMPLANT
DECANTER SPIKE VIAL GLASS SM (MISCELLANEOUS) ×3 IMPLANT
DERMABOND ADVANCED (GAUZE/BANDAGES/DRESSINGS) ×2
DERMABOND ADVANCED .7 DNX12 (GAUZE/BANDAGES/DRESSINGS) ×1 IMPLANT
DIFFUSER DRILL AIR PNEUMATIC (MISCELLANEOUS) ×3 IMPLANT
DRAPE LAPAROTOMY 100X72X124 (DRAPES) ×3 IMPLANT
DRAPE MICROSCOPE LEICA (MISCELLANEOUS) ×3 IMPLANT
DRAPE SURG 17X23 STRL (DRAPES) ×3 IMPLANT
DRSG OPSITE POSTOP 4X6 (GAUZE/BANDAGES/DRESSINGS) ×2 IMPLANT
DURAPREP 26ML APPLICATOR (WOUND CARE) ×3 IMPLANT
ELECT BLADE 4.0 EZ CLEAN MEGAD (MISCELLANEOUS) ×3
ELECT REM PT RETURN 9FT ADLT (ELECTROSURGICAL) ×3
ELECTRODE BLDE 4.0 EZ CLN MEGD (MISCELLANEOUS) IMPLANT
ELECTRODE REM PT RTRN 9FT ADLT (ELECTROSURGICAL) ×1 IMPLANT
GAUZE 4X4 16PLY RFD (DISPOSABLE) IMPLANT
GAUZE SPONGE 4X4 12PLY STRL (GAUZE/BANDAGES/DRESSINGS) IMPLANT
GLOVE BIOGEL PI IND STRL 7.5 (GLOVE) IMPLANT
GLOVE BIOGEL PI INDICATOR 7.5 (GLOVE) ×2
GLOVE ECLIPSE 6.5 STRL STRAW (GLOVE) ×3 IMPLANT
GLOVE EXAM NITRILE XL STR (GLOVE) IMPLANT
GLOVE SURG SS PI 7.0 STRL IVOR (GLOVE) ×4 IMPLANT
GOWN STRL REUS W/ TWL LRG LVL3 (GOWN DISPOSABLE) ×2 IMPLANT
GOWN STRL REUS W/ TWL XL LVL3 (GOWN DISPOSABLE) IMPLANT
GOWN STRL REUS W/TWL 2XL LVL3 (GOWN DISPOSABLE) IMPLANT
GOWN STRL REUS W/TWL LRG LVL3 (GOWN DISPOSABLE) ×6
GOWN STRL REUS W/TWL XL LVL3 (GOWN DISPOSABLE) ×3
KIT BASIN OR (CUSTOM PROCEDURE TRAY) ×3 IMPLANT
KIT TURNOVER KIT B (KITS) ×3 IMPLANT
NDL HYPO 18GX1.5 BLUNT FILL (NEEDLE) IMPLANT
NDL HYPO 25X1 1.5 SAFETY (NEEDLE) ×1 IMPLANT
NDL SPNL 18GX3.5 QUINCKE PK (NEEDLE) IMPLANT
NEEDLE HYPO 18GX1.5 BLUNT FILL (NEEDLE) ×3 IMPLANT
NEEDLE HYPO 25X1 1.5 SAFETY (NEEDLE) ×3 IMPLANT
NEEDLE SPNL 18GX3.5 QUINCKE PK (NEEDLE) ×3 IMPLANT
NS IRRIG 1000ML POUR BTL (IV SOLUTION) ×3 IMPLANT
OIL CARTRIDGE MAESTRO DRILL (MISCELLANEOUS) ×3
PACK LAMINECTOMY NEURO (CUSTOM PROCEDURE TRAY) ×3 IMPLANT
PAD ARMBOARD 7.5X6 YLW CONV (MISCELLANEOUS) ×13 IMPLANT
RUBBERBAND STERILE (MISCELLANEOUS) ×6 IMPLANT
SPONGE LAP 4X18 RFD (DISPOSABLE) IMPLANT
SPONGE SURGIFOAM ABS GEL SZ50 (HEMOSTASIS) ×3 IMPLANT
STRIP CLOSURE SKIN 1/2X4 (GAUZE/BANDAGES/DRESSINGS) IMPLANT
SUT VIC AB 0 CT1 18XCR BRD8 (SUTURE) ×1 IMPLANT
SUT VIC AB 0 CT1 8-18 (SUTURE) ×3
SUT VIC AB 2-0 CT1 18 (SUTURE) ×3 IMPLANT
SUT VIC AB 3-0 SH 8-18 (SUTURE) ×5 IMPLANT
SYR 5ML LL (SYRINGE) ×2 IMPLANT
TOWEL GREEN STERILE (TOWEL DISPOSABLE) ×3 IMPLANT
TOWEL GREEN STERILE FF (TOWEL DISPOSABLE) ×3 IMPLANT
WATER STERILE IRR 1000ML POUR (IV SOLUTION) ×3 IMPLANT

## 2019-06-27 NOTE — Transfer of Care (Signed)
Immediate Anesthesia Transfer of Care Note  Patient: Juan Franklin  Procedure(s) Performed: Left Lumbar Five Sacral One Microdiscectomy (Left Spine Lumbar)  Patient Location: PACU  Anesthesia Type:General  Level of Consciousness: awake, alert  and oriented  Airway & Oxygen Therapy: Patient Spontanous Breathing and Patient connected to nasal cannula oxygen  Post-op Assessment: Report given to RN, Post -op Vital signs reviewed and stable and Patient moving all extremities  Post vital signs: Reviewed and stable  Last Vitals:  Vitals Value Taken Time  BP 118/75 06/27/19 1626  Temp    Pulse 74 06/27/19 1626  Resp 21 06/27/19 1626  SpO2 100 % 06/27/19 1626  Vitals shown include unvalidated device data.  Last Pain:  Vitals:   06/27/19 1048  TempSrc:   PainSc: 6       Patients Stated Pain Goal: 2 (06/27/19 1048)  Complications: No apparent anesthesia complications

## 2019-06-27 NOTE — Anesthesia Postprocedure Evaluation (Signed)
Anesthesia Post Note  Patient: Juan Franklin  Procedure(s) Performed: Left Lumbar Five Sacral One Microdiscectomy (Left Spine Lumbar)     Patient location during evaluation: PACU Anesthesia Type: General Level of consciousness: awake and alert, oriented and patient cooperative Pain management: pain level controlled Vital Signs Assessment: post-procedure vital signs reviewed and stable Respiratory status: spontaneous breathing, nonlabored ventilation and respiratory function stable Cardiovascular status: blood pressure returned to baseline and stable Postop Assessment: no apparent nausea or vomiting and adequate PO intake Anesthetic complications: no    Last Vitals:  Vitals:   06/27/19 1028 06/27/19 1029  BP:  (!) 164/90  Pulse: 69   Resp: 18   Temp: 37 C   SpO2: 95%     Last Pain:  Vitals:   06/27/19 1048  TempSrc:   PainSc: 6                  Brydon Spahr,E. Guerin Lashomb

## 2019-06-27 NOTE — H&P (Signed)
Juan Franklin is a 44 y.o. male Whom has undergone previous lumbAr discetomies. He presents with pain in the left lower extremity. MRI shows a herniated disc at L5/S1 on the left side.  Allergies  Allergen Reactions  . Hydrocodone-Homatropine Itching    Has tolerated vicodin in the past  . Lisinopril Cough   Past Medical History:  Diagnosis Date  . Allergic rhinitis   . Anxiety   . GERD (gastroesophageal reflux disease)   . Hypertension   . Obstructive sleep apnea    Past Surgical History:  Procedure Laterality Date  . LUMBAR DISC SURGERY  5/10   Dr. Christella Noa  . LUMBAR DISC SURGERY  1/12   L4-5  . NASAL SINUS SURGERY  10/10   Dr. Richardson Landry   Family History  Problem Relation Age of Onset  . Cancer Mother        Breast   Social History   Socioeconomic History  . Marital status: Divorced    Spouse name: Not on file  . Number of children: 1  . Years of education: Not on file  . Highest education level: Not on file  Occupational History  . Occupation: Quality control    Comment: [employer]  Tobacco Use  . Smoking status: Current Every Day Smoker    Packs/day: 2.00    Years: 17.00    Pack years: 34.00    Types: Cigarettes  . Smokeless tobacco: Never Used  Substance and Sexual Activity  . Alcohol use: Yes    Alcohol/week: 6.0 standard drinks    Types: 6 Cans of beer per week    Comment: Quit after 3 DUI's.  Now drinks 6-8 at night when he doesn't have his daughter.  . Drug use: Never  . Sexual activity: Not on file  Other Topics Concern  . Not on file  Social History Narrative       Social Determinants of Health   Financial Resource Strain:   . Difficulty of Paying Living Expenses:   Food Insecurity:   . Worried About Charity fundraiser in the Last Year:   . Arboriculturist in the Last Year:   Transportation Needs:   . Film/video editor (Medical):   Marland Kitchen Lack of Transportation (Non-Medical):   Physical Activity:   . Days of Exercise per  Week:   . Minutes of Exercise per Session:   Stress:   . Feeling of Stress :   Social Connections:   . Frequency of Communication with Friends and Family:   . Frequency of Social Gatherings with Friends and Family:   . Attends Religious Services:   . Active Member of Clubs or Organizations:   . Attends Archivist Meetings:   Marland Kitchen Marital Status:   Intimate Partner Violence:   . Fear of Current or Ex-Partner:   . Emotionally Abused:   Marland Kitchen Physically Abused:   . Sexually Abused:    Physical Exam Constitutional:      Appearance: Normal appearance. He is obese.  HENT:     Head: Normocephalic and atraumatic.     Right Ear: Tympanic membrane normal.     Left Ear: Tympanic membrane normal.     Nose: Nose normal.     Mouth/Throat:     Mouth: Mucous membranes are dry.     Pharynx: Oropharynx is clear.  Eyes:     Extraocular Movements: Extraocular movements intact.     Pupils: Pupils are equal, round, and reactive to light.  Cardiovascular:     Rate and Rhythm: Normal rate and regular rhythm.     Pulses: Normal pulses.     Heart sounds: Normal heart sounds.  Pulmonary:     Effort: Pulmonary effort is normal.     Breath sounds: Normal breath sounds.  Abdominal:     General: Abdomen is flat.  Musculoskeletal:        General: Normal range of motion.  Skin:    General: Skin is warm and dry.  Neurological:     General: No focal deficit present.     Mental Status: He is alert and oriented to person, place, and time.     Cranial Nerves: No cranial nerve deficit.     Sensory: No sensory deficit.     Motor: No weakness.     Coordination: Coordination normal.     Gait: Gait abnormal.     Deep Tendon Reflexes: Reflexes normal.  Psychiatric:        Mood and Affect: Mood normal.        Behavior: Behavior normal.        Thought Content: Thought content normal.        Judgment: Judgment normal.   BP (!) 164/90   Pulse 69   Temp 98.6 F (37 C) (Oral)   Resp 18   Ht 6\' 1"   (1.854 m)   Wt (!) 173 kg   SpO2 95%   BMI 50.32 kg/m  Juan Franklin has decided to undergo a lumbar discetomy/decompression for an hnp at levels L5/S1. Risks and benefits including but not limited to bleeding, infection, paralysis, weakness in one or both extremities, bowel and/or bladder dysfunction, need for further surgery, no relief of pain. Juan Franklin understands and wishes to proceed.

## 2019-06-27 NOTE — Anesthesia Preprocedure Evaluation (Addendum)
Anesthesia Evaluation  Patient identified by MRN, date of birth, ID band Patient awake    Reviewed: Allergy & Precautions, NPO status , Patient's Chart, lab work & pertinent test results, reviewed documented beta blocker date and time   History of Anesthesia Complications Negative for: history of anesthetic complications  Airway Mallampati: II  TM Distance: >3 FB Neck ROM: Full    Dental  (+) Dental Advisory Given   Pulmonary sleep apnea , COPD, Current SmokerPatient did not abstain from smoking.,  06/25/2019 SARS coronavirus NEG   breath sounds clear to auscultation       Cardiovascular hypertension, Pt. on medications and Pt. on home beta blockers (-) angina Rhythm:Regular Rate:Normal     Neuro/Psych Anxiety Chronic back pain    GI/Hepatic Neg liver ROS, GERD  Controlled,  Endo/Other  Morbid obesity  Renal/GU negative Renal ROS     Musculoskeletal   Abdominal (+) + obese,   Peds  Hematology negative hematology ROS (+)   Anesthesia Other Findings   Reproductive/Obstetrics                            Anesthesia Physical Anesthesia Plan  ASA: III  Anesthesia Plan: General   Post-op Pain Management:    Induction: Intravenous  PONV Risk Score and Plan: 2 and Ondansetron and Dexamethasone  Airway Management Planned: Oral ETT  Additional Equipment: None  Intra-op Plan:   Post-operative Plan: Extubation in OR  Informed Consent: I have reviewed the patients History and Physical, chart, labs and discussed the procedure including the risks, benefits and alternatives for the proposed anesthesia with the patient or authorized representative who has indicated his/her understanding and acceptance.     Dental advisory given  Plan Discussed with: CRNA and Surgeon  Anesthesia Plan Comments:        Anesthesia Quick Evaluation

## 2019-06-27 NOTE — Discharge Instructions (Signed)
Lumbar Discectomy °Care After °A discectomy involves removal of discmaterial (the cartilage-like structures located between the bones of the back). It is done to relieve pressure on nerve roots. It can be used as a treatment for a back problem. The time in surgery depends on the findings in surgery and what is necessary to correct the problems. °HOME CARE INSTRUCTIONS  °· Check the cut (incision) made by the surgeon twice a day for signs of infection. Some signs of infection may include:  °· A foul smelling, greenish or yellowish discharge from the wound.  °· Increased pain.  °· Increased redness over the incision (operative) site.  °· The skin edges may separate.  °· Flu-like symptoms (problems).  °· A temperature above 101.5° F (38.6° C).  °· Change your bandages in about 24 to 36 hours following surgery or as directed.  °· You may shower tomrrow.  Avoid bathtubs, swimming pools and hot tubs for three weeks or until your incision has healed completely. °· Follow your doctor's instructions as to safe activities, exercises, and physical therapy.  °· Weight reduction may be beneficial if you are overweight.  °· Daily exercise is helpful to prevent the return of problems. Walking is permitted. You may use a treadmill without an incline. Cut down on activities and exercise if you have discomfort. You may also go up and down stairs as much as you can tolerate.  °· DO NOT lift anything heavier than 10 to 15 lbs. Avoid bending or twisting at the waist. Always bend your knees when lifting.  °· Maintain strength and range of motion as instructed.  °· Do not drive for 10 days, or as directed by your doctors. You may be a passenger . Lying back in the passenger seat may be more comfortable for you. Always wear a seatbelt.  °· Limit your sitting in a regular chair to 20 to 30 minutes at a time. There are no limitations for sitting in a recliner. You should lie down or walk in between sitting periods.  °· Only take  over-the-counter or prescription medicines for pain, discomfort, or fever as directed by your caregiver.  °SEEK MEDICAL CARE IF:  °· There is increased bleeding (more than a small spot) from the wound.  °· You notice redness, swelling, or increasing pain in the wound.  °· Pus is coming from wound.  °· You develop an unexplained oral temperature above 102° F (38.9° C) develops.  °· You notice a foul smell coming from the wound or dressing.  °· You have increasing pain in your wound.  °SEEK IMMEDIATE MEDICAL CARE IF:  °· You develop a rash.  °· You have difficulty breathing.  °· You develop any allergic problems to medicines given.  °Document Released: 02/17/2004 Document Revised: 03/03/2011 Document Reviewed: 06/07/2007 °ExitCare® Patient Information °

## 2019-06-27 NOTE — Anesthesia Procedure Notes (Signed)
Procedure Name: Intubation Date/Time: 06/27/2019 2:31 PM Performed by: Naveen Lorusso T, CRNA Pre-anesthesia Checklist: Patient identified, Emergency Drugs available, Suction available and Patient being monitored Patient Re-evaluated:Patient Re-evaluated prior to induction Oxygen Delivery Method: Circle system utilized Preoxygenation: Pre-oxygenation with 100% oxygen Induction Type: IV induction Ventilation: Mask ventilation without difficulty and Oral airway inserted - appropriate to patient size Laryngoscope Size: Miller and 3 Grade View: Grade I Tube type: Oral Tube size: 7.5 mm Number of attempts: 1 Airway Equipment and Method: Stylet and Oral airway Placement Confirmation: ETT inserted through vocal cords under direct vision,  positive ETCO2 and breath sounds checked- equal and bilateral Secured at: 23 cm Tube secured with: Tape Dental Injury: Teeth and Oropharynx as per pre-operative assessment

## 2019-06-27 NOTE — Op Note (Signed)
06/27/2019  4:44 PM  PATIENT:  Juan Franklin  44 y.o. male  PRE-OPERATIVE DIAGNOSIS:  Other intervertebral disc displacement, lumbar region, L5/S1  POST-OPERATIVE DIAGNOSIS:  Other intervertebral disc displacement,lumbar region, L5/S1  PROCEDURE:  Procedure(s): Left Lumbar Five Sacral One Microdiscectomy  SURGEON:   Surgeon(s): Coletta Memos, MD  ASSISTANTS:Elsner, Sherilyn Cooter  ANESTHESIA:   general  EBL:  Total I/O In: 1200 [I.V.:1200] Out: 50 [Blood:50]  BLOOD ADMINISTERED:none  CELL SAVER GIVEN:none  COUNT:per nursing  DRAINS: none   SPECIMEN:  No Specimen  DICTATION: [name] was taken to the operating room, intubated and placed under a general anesthetic without difficulty. He was positioned prone on a Wilson frame with all pressure points padded. His back was prepped and draped in a sterile manner. I opened the skin with a 10 blade and carried the dissection down to the thoracolumbar fascia. I used both sharp dissection and the monopolar cautery to expose the lamina of L5, and S1. I confirmed my location with an intraoperative xray.  I used the drill, Kerrison punches, and curettes to perform a semihemilaminectomy of L5. I used the punches to remove the ligamentum flavum to expose the thecal sac. I brought the microscope into the operative field and with Dr.Elsner's assistance we started our decompression of the spinal canal, thecal sac and S1 root(s). I cauterized epidural veins overlying the disc space then divided them sharply. I opened the disc space with a 15 blade and proceeded with the discectomy. I used pituitary rongeurs, curettes, and other instruments to remove disc material. After the discectomy was completed I inspected the left S1 nerve root and felt it was well decompressed. I explored rostrally, laterally, medially, and caudally and was satisfied with the decompression. I irrigated the wound, then closed in layers. I approximated the thoracolumbar fascia,  subcutaneous, and subcuticular planes with vicryl sutures. I used dermabond for a sterile dressing.   PLAN OF CARE: Discharge to home after PACU  PATIENT DISPOSITION:  PACU - hemodynamically stable.   Delay start of Pharmacological VTE agent (>24hrs) due to surgical blood loss or risk of bleeding:  yes

## 2019-06-28 ENCOUNTER — Other Ambulatory Visit: Payer: Self-pay | Admitting: Internal Medicine

## 2019-06-28 DIAGNOSIS — J309 Allergic rhinitis, unspecified: Secondary | ICD-10-CM

## 2019-07-05 ENCOUNTER — Other Ambulatory Visit: Payer: Self-pay

## 2019-07-08 ENCOUNTER — Ambulatory Visit: Payer: BC Managed Care – PPO | Admitting: Internal Medicine

## 2019-07-10 ENCOUNTER — Ambulatory Visit (INDEPENDENT_AMBULATORY_CARE_PROVIDER_SITE_OTHER): Payer: BC Managed Care – PPO | Admitting: Internal Medicine

## 2019-07-10 ENCOUNTER — Encounter: Payer: Self-pay | Admitting: Internal Medicine

## 2019-07-10 ENCOUNTER — Other Ambulatory Visit: Payer: Self-pay

## 2019-07-10 DIAGNOSIS — I10 Essential (primary) hypertension: Secondary | ICD-10-CM

## 2019-07-10 NOTE — Assessment & Plan Note (Signed)
BP Readings from Last 3 Encounters:  07/10/19 134/90  06/27/19 138/84  06/25/19 (!) 144/83   Some better now The metoprolol may help with his anxiety/nerves Continue all the current meds

## 2019-07-10 NOTE — Progress Notes (Signed)
Subjective:    Patient ID: Juan Franklin, male    DOB: 11/26/1975, 44 y.o.   MRN: [id number]  HPI Here for follow up of HTN This visit occurred during the SARS-CoV-2 public health emergency.  Safety protocols were in place, including screening questions prior to the visit, additional usage of staff PPE, and extensive cleaning of exam room while observing appropriate contact time as indicated for disinfecting solutions.   Just had back surgery He feels he is better--but confused about symptoms Still some left leg pain--notices it after walking for a while (or with extended sitting) Makes him nervous  Blood pressure not spiking like it was Checks at home most days Usually 150/90--this is considerably lower (not 200/120 anymore) Still stressed with work  Current Outpatient Medications on File Prior to Visit  Medication Sig Dispense Refill  . amLODipine (NORVASC) 5 MG tablet Take one tablet by mouth in AM. (Patient taking differently: Take 5 mg by mouth in the morning. ) 90 tablet 3  . chlorthalidone (HYGROTON) 25 MG tablet TAKE 1 TABLET BY MOUTH ONCE DAILY 90 tablet 3  . clonazePAM (KLONOPIN) 0.5 MG tablet TAKE 1 TABLET BY MOUTH TWICE A DAY AS NEEDED (Patient taking differently: Take 0.5 mg by mouth 2 (two) times daily as needed for anxiety. ) 180 tablet 0  . gabapentin (NEURONTIN) 300 MG capsule Take 600 mg by mouth at bedtime.     Marland Kitchen losartan (COZAAR) 100 MG tablet TAKE 1 TABLET BY MOUTH ONCE DAILY EACH EVENING. 90 tablet 3  . metoprolol succinate (TOPROL-XL) 50 MG 24 hr tablet Take 1 tablet (50 mg total) by mouth daily. Take with or immediately following a meal. 90 tablet 3  . montelukast (SINGULAIR) 10 MG tablet TAKE 1 TABLET BY MOUTH EVERY NIGHT AT BEDTIME 90 tablet 3  . oxyCODONE (OXY IR/ROXICODONE) 5 MG immediate release tablet Take 5 mg by mouth every 6 (six) hours as needed for severe pain.     No current facility-administered medications on file prior to visit.    Allergies   Allergen Reactions  . Hydrocodone-Homatropine Itching    Has tolerated vicodin in the past  . Lisinopril Cough    Past Medical History:  Diagnosis Date  . Allergic rhinitis   . Anxiety   . GERD (gastroesophageal reflux disease)   . Hypertension   . Obstructive sleep apnea     Past Surgical History:  Procedure Laterality Date  . LUMBAR DISC SURGERY  5/10   Dr. Franky Macho  . LUMBAR DISC SURGERY  1/12   L4-5  . LUMBAR LAMINECTOMY/DECOMPRESSION MICRODISCECTOMY Left 06/27/2019   Procedure: Left Lumbar Five Sacral One Microdiscectomy;  Surgeon: Coletta Memos, MD;  Location: [facility] OR;  Service: Neurosurgery;  Laterality: Left;  Posterior  . NASAL SINUS SURGERY  10/10   Dr. Willeen Cass    Family History  Problem Relation Age of Onset  . Cancer Mother        Breast    Social History   Socioeconomic History  . Marital status: Divorced    Spouse name: Not on file  . Number of children: 1  . Years of education: Not on file  . Highest education level: Not on file  Occupational History  . Occupation: Quality control    Comment: Neurosurgeon [employer]  Tobacco Use  . Smoking status: Current Every Day Smoker    Packs/day: 2.00    Years: 17.00    Pack years: 34.00    Types: Cigarettes  . Smokeless  tobacco: Never Used  Substance and Sexual Activity  . Alcohol use: Yes    Alcohol/week: 6.0 standard drinks    Types: 6 Cans of beer per week    Comment: Quit after 3 DUI's.  Now drinks 6-8 at night when he doesn't have his daughter.  . Drug use: Never  . Sexual activity: Not on file  Other Topics Concern  . Not on file  Social History Narrative       Social Determinants of Health   Financial Resource Strain:   . Difficulty of Paying Living Expenses:   Food Insecurity:   . Worried About Charity fundraiser in the Last Year:   . Arboriculturist in the Last Year:   Transportation Needs:   . Film/video editor (Medical):   Marland Kitchen Lack of Transportation (Non-Medical):   Physical  Activity:   . Days of Exercise per Week:   . Minutes of Exercise per Session:   Stress:   . Feeling of Stress :   Social Connections:   . Frequency of Communication with Friends and Family:   . Frequency of Social Gatherings with Friends and Family:   . Attends Religious Services:   . Active Member of Clubs or Organizations:   . Attends Archivist Meetings:   Marland Kitchen Marital Status:   Intimate Partner Violence:   . Fear of Current or Ex-Partner:   . Emotionally Abused:   Marland Kitchen Physically Abused:   . Sexually Abused:    Review of Systems No chest pain No SOB    Objective:   Physical Exam  Constitutional: He appears well-developed. No distress.  Neck: No thyromegaly present.  Cardiovascular: Normal rate, regular rhythm and normal heart sounds. Exam reveals no gallop.  No murmur heard. Respiratory: Effort normal and breath sounds normal. No respiratory distress. He has no wheezes. He has no rales.  Musculoskeletal:        General: No edema.  Lymphadenopathy:    He has no cervical adenopathy.           Assessment & Plan:

## 2019-08-22 ENCOUNTER — Other Ambulatory Visit: Payer: Self-pay

## 2019-08-22 ENCOUNTER — Encounter: Payer: Self-pay | Admitting: Family Medicine

## 2019-08-22 ENCOUNTER — Ambulatory Visit (INDEPENDENT_AMBULATORY_CARE_PROVIDER_SITE_OTHER): Payer: BC Managed Care – PPO | Admitting: Family Medicine

## 2019-08-22 VITALS — BP 140/100 | HR 70 | Temp 98.2°F | Ht 73.0 in | Wt 380.5 lb

## 2019-08-22 DIAGNOSIS — I10 Essential (primary) hypertension: Secondary | ICD-10-CM | POA: Diagnosis not present

## 2019-08-22 DIAGNOSIS — M109 Gout, unspecified: Secondary | ICD-10-CM

## 2019-08-22 LAB — URIC ACID: Uric Acid, Serum: 5.9 mg/dL (ref 4.0–7.8)

## 2019-08-22 NOTE — Progress Notes (Signed)
Chief Complaint  Patient presents with  . Toe Pain    Right Big Toe    History of Present Illness: HPI    44 year old morbidly obese male presents with new onset pain in right great toe.  4 days ago he twisted right ankle.Marland Kitchen ankle was sore , has since resolved.  Following anjkle injury he noted pain, redness, swelling and heat at base of right great toe. Severe pain 7-8/10 on pain scale. Pain not improving with tylenol.  He eats a lot of steak and drinks beer.  He is  On chlorthalidone  no personal or family history of gout.   BP today is elevated .. he did not take BP meds today. BP Readings from Last 3 Encounters:  08/22/19 (!) 140/100  07/10/19 134/90  06/27/19 138/84      This visit occurred during the SARS-CoV-2 public health emergency.  Safety protocols were in place, including screening questions prior to the visit, additional usage of staff PPE, and extensive cleaning of exam room while observing appropriate contact time as indicated for disinfecting solutions.   COVID 19 screen:  No recent travel or known exposure to COVID19 The patient denies respiratory symptoms of COVID 19 at this time. The importance of social distancing was discussed today.     Review of Systems  Constitutional: Negative for chills and fever.  HENT: Negative for congestion and ear pain.   Eyes: Negative for pain and redness.  Respiratory: Negative for cough and shortness of breath.   Cardiovascular: Negative for chest pain, palpitations and leg swelling.  Gastrointestinal: Negative for abdominal pain, blood in stool, constipation, diarrhea, nausea and vomiting.  Genitourinary: Negative for dysuria.  Musculoskeletal: Negative for falls and myalgias.  Skin: Negative for rash.  Neurological: Negative for dizziness.  Psychiatric/Behavioral: Negative for depression. The patient is not nervous/anxious.       Past Medical History:  Diagnosis Date  . Allergic rhinitis   . Anxiety   . GERD  (gastroesophageal reflux disease)   . Hypertension   . Obstructive sleep apnea     reports that he has been smoking cigarettes. He has a 34.00 pack-year smoking history. He has never used smokeless tobacco. He reports current alcohol use of about 6.0 standard drinks of alcohol per week. He reports that he does not use drugs.   Current Outpatient Medications:  .  amLODipine (NORVASC) 5 MG tablet, Take one tablet by mouth in AM., Disp: 90 tablet, Rfl: 3 .  chlorthalidone (HYGROTON) 25 MG tablet, TAKE 1 TABLET BY MOUTH ONCE DAILY, Disp: 90 tablet, Rfl: 3 .  clonazePAM (KLONOPIN) 0.5 MG tablet, TAKE 1 TABLET BY MOUTH TWICE A DAY AS NEEDED, Disp: 180 tablet, Rfl: 0 .  gabapentin (NEURONTIN) 300 MG capsule, Take 600 mg by mouth at bedtime. , Disp: , Rfl:  .  losartan (COZAAR) 100 MG tablet, TAKE 1 TABLET BY MOUTH ONCE DAILY EACH EVENING., Disp: 90 tablet, Rfl: 3 .  metoprolol succinate (TOPROL-XL) 50 MG 24 hr tablet, Take 1 tablet (50 mg total) by mouth daily. Take with or immediately following a meal., Disp: 90 tablet, Rfl: 3 .  montelukast (SINGULAIR) 10 MG tablet, TAKE 1 TABLET BY MOUTH EVERY NIGHT AT BEDTIME, Disp: 90 tablet, Rfl: 3   Observations/Objective: Blood pressure (!) 140/100, pulse 70, temperature 98.2 F (36.8 C), temperature source Temporal, height 6\' 1"  (1.854 m), weight (!) 380 lb 8 oz (172.6 kg), SpO2 97 %.  Physical Exam Constitutional:  Appearance: He is well-developed. He is obese.  HENT:     Head: Normocephalic.     Right Ear: Hearing normal.     Left Ear: Hearing normal.     Nose: Nose normal.  Neck:     Thyroid: No thyroid mass or thyromegaly.     Vascular: No carotid bruit.     Trachea: Trachea normal.  Cardiovascular:     Rate and Rhythm: Normal rate and regular rhythm.     Pulses: Normal pulses.     Heart sounds: Heart sounds not distant. No murmur. No friction rub. No gallop.      Comments: No peripheral edema Pulmonary:     Effort: Pulmonary effort is  normal. No respiratory distress.     Breath sounds: Normal breath sounds.  Musculoskeletal:     Right foot: Decreased range of motion. Swelling, deformity and tenderness present.     Comments: Heat , redness, pain to palpation of MCP joint of 1st digit  Skin:    General: Skin is warm and dry.     Findings: No rash.  Psychiatric:        Speech: Speech normal.        Behavior: Behavior normal.        Thought Content: Thought content normal.      Assessment and Plan Gout involving toe of right foot Possible new dx. Stop chlorthalidone, eval uric acid, work on low purine diet.  Treat with course of prednisone.   Essential hypertension, benign May be elevated today given did not take any BP meds and in pain. Follow at home off chlorthalidone.. if not at goal will likely need to increase the amlodipine.        Kerby Nora, MD

## 2019-08-23 DIAGNOSIS — M109 Gout, unspecified: Secondary | ICD-10-CM | POA: Insufficient documentation

## 2019-08-23 NOTE — Assessment & Plan Note (Signed)
Possible new dx. Stop chlorthalidone, eval uric acid, work on low purine diet.  Treat with course of prednisone.

## 2019-08-23 NOTE — Assessment & Plan Note (Signed)
May be elevated today given did not take any BP meds and in pain. Follow at home off chlorthalidone.. if not at goal will likely need to increase the amlodipine.

## 2019-09-16 ENCOUNTER — Other Ambulatory Visit: Payer: Self-pay | Admitting: Internal Medicine

## 2019-09-17 NOTE — Telephone Encounter (Signed)
Last written 06-03-19 #180 (broken down into 30 day refills. Last filled [phone] #[id number]) Last OV 08-22-19 Next OV 12-20-19 [facility]

## 2019-12-13 ENCOUNTER — Encounter: Payer: BC Managed Care – PPO | Admitting: Internal Medicine

## 2019-12-20 ENCOUNTER — Other Ambulatory Visit: Payer: Self-pay

## 2019-12-20 ENCOUNTER — Ambulatory Visit (INDEPENDENT_AMBULATORY_CARE_PROVIDER_SITE_OTHER): Payer: BC Managed Care – PPO | Admitting: Internal Medicine

## 2019-12-20 ENCOUNTER — Encounter: Payer: Self-pay | Admitting: Internal Medicine

## 2019-12-20 VITALS — BP 146/86 | HR 71 | Temp 98.0°F | Ht 73.25 in | Wt 395.5 lb

## 2019-12-20 DIAGNOSIS — G4733 Obstructive sleep apnea (adult) (pediatric): Secondary | ICD-10-CM

## 2019-12-20 DIAGNOSIS — I10 Essential (primary) hypertension: Secondary | ICD-10-CM

## 2019-12-20 DIAGNOSIS — M545 Low back pain, unspecified: Secondary | ICD-10-CM

## 2019-12-20 DIAGNOSIS — Z0000 Encounter for general adult medical examination without abnormal findings: Secondary | ICD-10-CM | POA: Diagnosis not present

## 2019-12-20 LAB — CBC
HCT: 41.3 % (ref 39.0–52.0)
Hemoglobin: 14.4 g/dL (ref 13.0–17.0)
MCHC: 34.9 g/dL (ref 30.0–36.0)
MCV: 93.1 fl (ref 78.0–100.0)
Platelets: 245 10*3/uL (ref 150.0–400.0)
RBC: 4.44 Mil/uL (ref 4.22–5.81)
RDW: 13.3 % (ref 11.5–15.5)
WBC: 9.1 10*3/uL (ref 4.0–10.5)

## 2019-12-20 LAB — COMPREHENSIVE METABOLIC PANEL
ALT: 55 U/L — ABNORMAL HIGH (ref 0–53)
AST: 35 U/L (ref 0–37)
Albumin: 4.3 g/dL (ref 3.5–5.2)
Alkaline Phosphatase: 98 U/L (ref 39–117)
BUN: 18 mg/dL (ref 6–23)
CO2: 30 mEq/L (ref 19–32)
Calcium: 9 mg/dL (ref 8.4–10.5)
Chloride: 101 mEq/L (ref 96–112)
Creatinine, Ser: 0.92 mg/dL (ref 0.40–1.50)
GFR: 89.43 mL/min (ref >60.00)
Glucose, Bld: 101 mg/dL — ABNORMAL HIGH (ref 70–99)
Potassium: 4 mEq/L (ref 3.5–5.1)
Sodium: 139 mEq/L (ref 135–145)
Total Bilirubin: 0.4 mg/dL (ref 0.2–1.2)
Total Protein: 6.8 g/dL (ref 6.0–8.3)

## 2019-12-20 LAB — URIC ACID: Uric Acid, Serum: 6.8 mg/dL (ref 4.0–7.8)

## 2019-12-20 MED ORDER — METOPROLOL SUCCINATE ER 100 MG PO TB24: 100.0000 mg | ORAL_TABLET | Freq: Every day | ORAL | 3 refills | Status: DC

## 2019-12-20 NOTE — Assessment & Plan Note (Signed)
BP Readings from Last 3 Encounters:  12/20/19 (!) 146/86  08/22/19 (!) 140/100  07/10/19 134/90   Not controlled Doesn't like the amlodipine---edema Back on the chlorthalidone---will monitor for gout Increase metoprolol Consider a different CCB

## 2019-12-20 NOTE — Progress Notes (Signed)
Subjective:    Patient ID: Juan Franklin, male    DOB: 12-29-1975, 44 y.o.   MRN: [id number]  HPI Here for physical This visit occurred during the SARS-CoV-2 public health emergency.  Safety protocols were in place, including screening questions prior to the visit, additional usage of staff PPE, and extensive cleaning of exam room while observing appropriate contact time as indicated for disinfecting solutions.   Had 1 apparent gout attack Feels it came on after spraining his ankle a second time Went off the chlorthalidone Has gone back on it for the past month----and no gout problems  Noted some worsening edema off the chlorthalidone Improved off the amlodipine Fine in AM and worsen as day goes on Didn't restart the amlodipine No problems with metoprolol  Still smoking---discussed Regained weight after back surgery Now limiting to 8 beers a night  Current Outpatient Medications on File Prior to Visit  Medication Sig Dispense Refill  . chlorthalidone (HYGROTON) 25 MG tablet TAKE 1 TABLET BY MOUTH ONCE DAILY 90 tablet 3  . clonazePAM (KLONOPIN) 0.5 MG tablet TAKE 1 TABLET BY MOUTH TWICE A DAY AS NEEDED 180 tablet 0  . gabapentin (NEURONTIN) 300 MG capsule Take 600 mg by mouth at bedtime.     Marland Kitchen losartan (COZAAR) 100 MG tablet TAKE 1 TABLET BY MOUTH ONCE DAILY EACH EVENING. 90 tablet 3  . metoprolol succinate (TOPROL-XL) 50 MG 24 hr tablet Take 1 tablet (50 mg total) by mouth daily. Take with or immediately following a meal. 90 tablet 3  . montelukast (SINGULAIR) 10 MG tablet TAKE 1 TABLET BY MOUTH EVERY NIGHT AT BEDTIME 90 tablet 3  . amLODipine (NORVASC) 5 MG tablet Take one tablet by mouth in AM. (Patient not taking: Reported on 12/20/2019) 90 tablet 3   No current facility-administered medications on file prior to visit.    Allergies  Allergen Reactions  . Hydrocodone-Homatropine Itching    Has tolerated vicodin in the past  . Lisinopril Cough    Past Medical History:    Diagnosis Date  . Allergic rhinitis   . Anxiety   . GERD (gastroesophageal reflux disease)   . Hypertension   . Obstructive sleep apnea     Past Surgical History:  Procedure Laterality Date  . LUMBAR DISC SURGERY  5/10   Dr. Franky Macho  . LUMBAR DISC SURGERY  1/12   L4-5  . LUMBAR LAMINECTOMY/DECOMPRESSION MICRODISCECTOMY Left 06/27/2019   Procedure: Left Lumbar Five Sacral One Microdiscectomy;  Surgeon: Coletta Memos, MD;  Location: [facility] OR;  Service: Neurosurgery;  Laterality: Left;  Posterior  . NASAL SINUS SURGERY  10/10   Dr. Wille[employer] Cass    Family History  Problem Relation Age of Onset  . Cancer Mother        Breast    Social History   Socioeconomic History  . Marital status: Divorced    Spouse name: Not on file  . Number of children: 1  . Years of education: Not on file  . Highest education level: Not on file  Occupational History  . Occupation: Quality control    Comment: [employer]  Tobacco Use  . Smoking status: Current Every Day Smoker    Packs/day: 2.00    Years: 17.00    Pack years: 34.00    Types: Cigarettes  . Smokeless tobacco: Never Used  Substance and Sexual Activity  . Alcohol use: Yes    Alcohol/week: 6.0 standard drinks    Types: 6 Cans of beer per week  Comment: Quit after 3 DUI's.  Now drinks 6-8 at night when he doesn't have his daughter.  . Drug use: Never  . Sexual activity: Not on file  Other Topics Concern  . Not on file  Social History Narrative       Social Determinants of Health   Financial Resource Strain:   . Difficulty of Paying Living Expenses: Not on file  Food Insecurity:   . Worried About Programme researcher, broadcasting/film/video in the Last Year: Not on file  . Ran Out of Food in the Last Year: Not on file  Transportation Needs:   . Lack of Transportation (Medical): Not on file  . Lack of Transportation (Non-Medical): Not on file  Physical Activity:   . Days of Exercise per Week: Not on file  . Minutes of Exercise per Session:  Not on file  Stress:   . Feeling of Stress : Not on file  Social Connections:   . Frequency of Communication with Friends and Family: Not on file  . Frequency of Social Gatherings with Friends and Family: Not on file  . Attends Religious Services: Not on file  . Active Member of Clubs or Organizations: Not on file  . Attends Banker Meetings: Not on file  . Marital Status: Not on file  Intimate Partner Violence:   . Fear of Current or Ex-Partner: Not on file  . Emotionally Abused: Not on file  . Physically Abused: Not on file  . Sexually Abused: Not on file   Review of Systems  Constitutional: Positive for unexpected weight change. Negative for fatigue.       Wears seat belt  HENT: Negative for dental problem.        Left ear hearing is off  Eyes: Negative for visual disturbance.       No diplopia or unilateral vision loss  Respiratory: Negative for cough, chest tightness and shortness of breath.   Cardiovascular: Positive for palpitations and leg swelling. Negative for chest pain.  Gastrointestinal: Negative for blood in stool and constipation.       No heartburn  Endocrine: Negative for polydipsia and polyuria.  Genitourinary: Negative for difficulty urinating and urgency.       No sexual problems  Musculoskeletal: Positive for arthralgias. Negative for joint swelling.       Back pain better since surgery  Skin: Negative for rash.  Allergic/Immunologic: Negative for environmental allergies and immunocompromised state.  Neurological: Negative for dizziness, syncope, light-headedness and headaches.  Hematological: Negative for adenopathy. Does not bruise/bleed easily.  Psychiatric/Behavioral: Negative for dysphoric mood and sleep disturbance. The patient is not nervous/anxious.        Objective:   Physical Exam Constitutional:      Appearance: Normal appearance.  HENT:     Right Ear: Tympanic membrane, ear canal and external ear normal.     Left Ear:  Tympanic membrane, ear canal and external ear normal.     Mouth/Throat:     Pharynx: No oropharyngeal exudate or posterior oropharyngeal erythema.  Eyes:     Conjunctiva/sclera: Conjunctivae normal.     Pupils: Pupils are equal, round, and reactive to light.  Cardiovascular:     Rate and Rhythm: Normal rate and regular rhythm.     Pulses: Normal pulses.     Heart sounds: No murmur heard.  No gallop.   Pulmonary:     Effort: Pulmonary effort is normal.     Breath sounds: Normal breath sounds. No wheezing or rales.  Abdominal:     Palpations: Abdomen is soft.     Tenderness: There is no abdominal tenderness.  Musculoskeletal:     Cervical back: Neck supple.     Right lower leg: No edema.     Left lower leg: No edema.  Lymphadenopathy:     Cervical: No cervical adenopathy.  Skin:    General: Skin is warm.     Findings: No rash.  Neurological:     General: No focal deficit present.     Mental Status: He is alert and oriented to person, place, and time.  Psychiatric:        Mood and Affect: Mood normal.        Behavior: Behavior normal.            Assessment & Plan:

## 2019-12-20 NOTE — Assessment & Plan Note (Signed)
Some better after surgery Only sporadic with the gabapentin

## 2019-12-20 NOTE — Assessment & Plan Note (Signed)
Multiple medical issues Discussed low carb diet Needs to try stopping smoking again Asked him to consider the COVID vaccine! Doesn't want flu shot Not old enough for cancer screening

## 2019-12-20 NOTE — Assessment & Plan Note (Signed)
Uses machine nightly  His is old---will look into getting a new machine

## 2019-12-25 ENCOUNTER — Other Ambulatory Visit: Payer: Self-pay | Admitting: Internal Medicine

## 2019-12-26 NOTE — Telephone Encounter (Signed)
Last filled 09-17-19 #180 Last OV 12-20-19 Next OV 04-08-20 [facility]

## 2020-02-12 ENCOUNTER — Ambulatory Visit (INDEPENDENT_AMBULATORY_CARE_PROVIDER_SITE_OTHER): Payer: BC Managed Care – PPO | Admitting: Internal Medicine

## 2020-02-12 ENCOUNTER — Other Ambulatory Visit: Payer: Self-pay

## 2020-02-12 ENCOUNTER — Encounter: Payer: Self-pay | Admitting: Internal Medicine

## 2020-02-12 DIAGNOSIS — S46812A Strain of other muscles, fascia and tendons at shoulder and upper arm level, left arm, initial encounter: Secondary | ICD-10-CM | POA: Diagnosis not present

## 2020-02-12 MED ORDER — PREDNISONE 20 MG PO TABS: 40.0000 mg | ORAL_TABLET | Freq: Every day | ORAL | 0 refills | Status: DC

## 2020-02-12 NOTE — Progress Notes (Signed)
Subjective:    Patient ID: Juan Franklin, male    DOB: 10-20-1975, 44 y.o.   MRN: [id number]  HPI Here due to neck and shoulder pain This visit occurred during the SARS-CoV-2 public health emergency.  Safety protocols were in place, including screening questions prior to the visit, additional usage of staff PPE, and extensive cleaning of exam room while observing appropriate contact time as indicated for disinfecting solutions.   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 (~10 days ago) Had to do service on it also Turned wrong----and noticed catch in neck  Pain in center of neck and trouble turning head---- pain close to thoracic levels   After a couple of days--noticed some mild weakness in left arm Has had trouble sleeping--tossing and turning (some pain in shoulder also) Not really painful in arm Pulling in left posterior shoulder Pain still present in back of neck  Got massage 5 days ago--helped for 5 hours Has home massager also Ibuprofen 800mg  bid---may help some  No heat or ice  Current Outpatient Medications on File Prior to Visit  Medication Sig Dispense Refill   chlorthalidone (HYGROTON) 25 MG tablet TAKE 1 TABLET BY MOUTH ONCE DAILY 90 tablet 3   clonazePAM (KLONOPIN) 0.5 MG tablet TAKE 1 TABLET BY MOUTH TWICE A DAY AS NEEDED 180 tablet 0   gabapentin (NEURONTIN) 300 MG capsule Take 600 mg by mouth at bedtime.      losartan (COZAAR) 100 MG tablet TAKE 1 TABLET BY MOUTH ONCE DAILY EACH EVENING. 90 tablet 3   metoprolol succinate (TOPROL-XL) 100 MG 24 hr tablet Take 1 tablet (100 mg total) by mouth daily. Take with or immediately following a meal. 90 tablet 3   montelukast (SINGULAIR) 10 MG tablet TAKE 1 TABLET BY MOUTH EVERY NIGHT AT BEDTIME 90 tablet 3   No current facility-administered medications on file prior to visit.    Allergies  Allergen Reactions   Hydrocodone-Homatropine Itching    Has tolerated vicodin in the past   Lisinopril Cough    Past  Medical History:  Diagnosis Date   Allergic rhinitis    Anxiety    GERD (gastroesophageal reflux disease)    Hypertension    Obstructive sleep apnea     Past Surgical History:  Procedure Laterality Date   LUMBAR DISC SURGERY  5/10   Dr. 7/10   LUMBAR DISC SURGERY  1/12   L4-5   LUMBAR LAMINECTOMY/DECOMPRESSION MICRODISCECTOMY Left 06/27/2019   Procedure: Left Lumbar Five Sacral One Microdiscectomy;  Surgeon: 08/27/2019, MD;  Location[facility] OR;  Service: Neurosurgery;  Laterality: Left;  Posterior   NASAL SINUS SURGERY  10/10   Dr. [facility]    Family History  Problem Relation Age of Onset   Cancer Mother        Breast    Social History   Socioeconomic History   Marital status: Divorced    Spouse name: Not on file   Number of children: 1   Years of education: Not on file   Highest education level: Not on file  Occupational History   Occupation: Quality control    Comment: [employer]  Tobacco Use   Smoking status: Current Every Day Smoker    Packs/day: 2.00    Years: 17.00    Pack years: 34.00    Types: Cigarettes   Smokeless tobacco: Never Used  Substance and Sexual Activity   Alcohol use: Yes    Alcohol/week: 6.0 standard drinks    Types:  6 Cans of beer per week    Comment: Quit after 3 DUI's.  Now drinks 6-8 at night when he doesn't have his daughter.   Drug use: Never   Sexual activity: Not on file  Other Topics Concern   Not on file  Social History Narrative       Social Determinants of Health   Financial Resource Strain:    Difficulty of Paying Living Expenses: Not on file  Food Insecurity:    Worried About Running Out of Food in the Last Year: Not on file   Ran Out of Food in the Last Year: Not on file  Transportation Needs:    Lack of Transportation (Medical): Not on file   Lack of Transportation (Non-Medical): Not on file  Physical Activity:    Days of Exercise per Week: Not on file   Minutes of  Exercise per Session: Not on file  Stress:    Feeling of Stress : Not on file  Social Connections:    Frequency of Communication with Friends and Family: Not on file   Frequency of Social Gatherings with Friends and Family: Not on file   Attends Religious Services: Not on file   Active Member of Clubs or Organizations: Not on file   Attends Banker Meetings: Not on file   Marital Status: Not on file  Intimate Partner Violence:    Fear of Current or Ex-Partner: Not on file   Emotionally Abused: Not on file   Physically Abused: Not on file   Sexually Abused: Not on file   Review of Systems  No clear sensory changes in arms---has intermittent numbness in both hands Not dropping things     Objective:   Physical Exam Neck:     Comments: Fair active ROM in neck Tenderness along left trapezius  Musculoskeletal:     Cervical back: No rigidity.     Comments: Fairly normal ROM in left shoulder No tenderness  Lymphadenopathy:     Cervical: No cervical adenopathy.  Neurological:     Comments: No obvious weakness in left arm            Assessment & Plan:

## 2020-02-12 NOTE — Assessment & Plan Note (Signed)
Doesn't seem to have radiculopathy Discussed heat, massage and the ibuprofen Will give 5 days of prednisone just in case He can double his bedtime gabapentin for now (didn't do well with muscle relaxers)

## 2020-02-28 ENCOUNTER — Telehealth: Payer: Self-pay | Admitting: Internal Medicine

## 2020-02-28 NOTE — Telephone Encounter (Signed)
Achy, temp of 99.0. Slight chest congestion. A lot of head congestion. Intermittent wet cough. Started 2 days ago. Thinks he was exposed at a funeral over Thanksgiving. 6 others have tested positive. No one was wearing masks.   Asking about work. How long does he need to be out. When to retest to go back to work.

## 2020-02-28 NOTE — Telephone Encounter (Signed)
Yes---discussed with me and further Rx (ie--infusion) does not appear to be indicated

## 2020-02-28 NOTE — Telephone Encounter (Signed)
Spoke to pt. Advised him to quarantine 10 days from onset of symptoms and that he can return to work after 72 hours symptom free. He is actually out of work the week of 12-13 through Christmas. I wrote him a note and put it on MyChart.

## 2020-02-28 NOTE — Telephone Encounter (Signed)
He has COVID if he has had 2 positive tests!!! See if he is having symptoms ---and if he wants to be considered for the monoclonal antibody Rx (he would need formal testing)

## 2020-02-28 NOTE — Telephone Encounter (Signed)
Pt called in wanted to know what to do due to he took 2 covid test and they came back positive and wanted to do another one.  Please advise

## 2020-04-08 ENCOUNTER — Other Ambulatory Visit: Payer: Self-pay

## 2020-04-08 ENCOUNTER — Encounter: Payer: Self-pay | Admitting: Internal Medicine

## 2020-04-08 ENCOUNTER — Ambulatory Visit (INDEPENDENT_AMBULATORY_CARE_PROVIDER_SITE_OTHER): Payer: BC Managed Care – PPO | Admitting: Internal Medicine

## 2020-04-08 DIAGNOSIS — M10071 Idiopathic gout, right ankle and foot: Secondary | ICD-10-CM | POA: Diagnosis not present

## 2020-04-08 DIAGNOSIS — I10 Essential (primary) hypertension: Secondary | ICD-10-CM | POA: Diagnosis not present

## 2020-04-08 DIAGNOSIS — R200 Anesthesia of skin: Secondary | ICD-10-CM | POA: Insufficient documentation

## 2020-04-08 NOTE — Patient Instructions (Signed)
Try the wrist splints at night---and perhaps while you are on the computer.  If the pain continues, I will set you up with a hand specialist

## 2020-04-08 NOTE — Progress Notes (Signed)
Subjective:    Patient ID: Juan Franklin, male    DOB: 1975-08-13, 45 y.o.   MRN: [id number]  HPI Here for follow up of hypertension This visit occurred during the SARS-CoV-2 public health emergency.  Safety protocols were in place, including screening questions prior to the visit, additional usage of staff PPE, and extensive cleaning of exam room while observing appropriate contact time as indicated for disinfecting solutions.   Having some trouble with hands---fingertips are numb Has pain on volar wrist--mostly in the morning Pain up in neck--?sleeping wrong on it Hard to hold glass even--due to pain---does improve with massager on hand  Works on keyboard all day--discussed positioning  Is taking B6 and alpha lipoic acid  Back on chlorthalidone---no gout issues Ongoing fluid in legs and feet though (sitting at desk all day) Has tried support socks--may help some On losartan and metoprolol still  Current Outpatient Medications on File Prior to Visit  Medication Sig Dispense Refill  . chlorthalidone (HYGROTON) 25 MG tablet TAKE 1 TABLET BY MOUTH ONCE DAILY 90 tablet 3  . clonazePAM (KLONOPIN) 0.5 MG tablet TAKE 1 TABLET BY MOUTH TWICE A DAY AS NEEDED 180 tablet 0  . gabapentin (NEURONTIN) 300 MG capsule Take 600 mg by mouth at bedtime.     Marland Kitchen losartan (COZAAR) 100 MG tablet TAKE 1 TABLET BY MOUTH ONCE DAILY EACH EVENING. 90 tablet 3  . metoprolol succinate (TOPROL-XL) 100 MG 24 hr tablet Take 1 tablet (100 mg total) by mouth daily. Take with or immediately following a meal. 90 tablet 3  . montelukast (SINGULAIR) 10 MG tablet TAKE 1 TABLET BY MOUTH EVERY NIGHT AT BEDTIME 90 tablet 3   No current facility-administered medications on file prior to visit.    Allergies  Allergen Reactions  . Hydrocodone-Homatropine Itching    Has tolerated vicodin in the past  . Lisinopril Cough    Past Medical History:  Diagnosis Date  . Allergic rhinitis   . Anxiety   . GERD  (gastroesophageal reflux disease)   . Hypertension   . Obstructive sleep apnea     Past Surgical History:  Procedure Laterality Date  . LUMBAR DISC SURGERY  5/10   Dr. Franky Macho  . LUMBAR DISC SURGERY  1/12   L4-5  . LUMBAR LAMINECTOMY/DECOMPRESSION MICRODISCECTOMY Left 06/27/2019   Procedure: Left Lumbar Five Sacral One Microdiscectomy;  Surgeon: Coletta Memos, MD;  Location: [facility] OR;  Service: Neurosurgery;  Laterality: Left;  Posterior  . NASAL SINUS SURGERY  10/10   Dr. Willeen Cass    Family History  Problem Relation Age of Onset  . Cancer Mother        Breast    Social History   Socioeconomic History  . Marital status: Divorced    Spouse name: Not on file  . Number of children: 1  . Years of education: Not on file  . Highest education level: Not on file  Occupational History  . Occupation: Quality control    Comment: [employer]  Tobacco Use  . Smoking status: Current Every Day Smoker    Packs/day: 2.00    Years: 17.00    Pack years: 34.00    Types: Cigarettes  . Smokeless tobacco: Never Used  Substance and Sexual Activity  . Alcohol use: Yes    Alcohol/week: 6.0 standard drinks    Types: 6 Cans of beer per week    Comment: Quit after 3 DUI's.  Now drinks 6-8 at night when he doesn't have his  daughter.  . Drug use: Never  . Sexual activity: Not on file  Other Topics Concern  . Not on file  Social History Narrative       Social Determinants of Health   Financial Resource Strain: Not on file  Food Insecurity: Not on file  Transportation Needs: Not on file  Physical Activity: Not on file  Stress: Not on file  Social Connections: Not on file  Intimate Partner Violence: Not on file   Review of Systems  Uses salt on food Weight up slightly    Objective:   Physical Exam Constitutional:      Appearance: Normal appearance.  Cardiovascular:     Rate and Rhythm: Normal rate and regular rhythm.     Heart sounds: No murmur heard. No gallop.    Pulmonary:     Effort: Pulmonary effort is normal.     Breath sounds: Normal breath sounds. No wheezing or rales.  Musculoskeletal:     Cervical back: Neck supple.     Comments: Thick calves but no pitting  Lymphadenopathy:     Cervical: No cervical adenopathy.  Neurological:     Mental Status: He is alert.     Comments: No arm or hand weakness            Assessment & Plan:

## 2020-04-08 NOTE — Assessment & Plan Note (Signed)
BP Readings from Last 3 Encounters:  04/08/20 127/84  02/12/20 126/88  12/20/19 (!) 146/86   Good control on losartan, metoprolol and chlorthalidone Discussed cutting down on salt to prevent the edema

## 2020-04-08 NOTE — Assessment & Plan Note (Signed)
Likely CTS B6 and alpha lipoic acid not helping--can stop Discussed wrist splints for sleep and maybe while on the computer Hand specialist if not improving

## 2020-04-08 NOTE — Assessment & Plan Note (Signed)
No recurrences Using cherry tablet daily

## 2020-05-18 ENCOUNTER — Other Ambulatory Visit: Payer: Self-pay | Admitting: Internal Medicine

## 2020-05-18 NOTE — Telephone Encounter (Signed)
Last filled 12-26-19 #180 Last OV 04-08-20 Next OV 04-13-21 [facility]

## 2020-07-21 ENCOUNTER — Other Ambulatory Visit: Payer: Self-pay | Admitting: Internal Medicine

## 2020-07-21 DIAGNOSIS — J309 Allergic rhinitis, unspecified: Secondary | ICD-10-CM

## 2020-07-24 ENCOUNTER — Other Ambulatory Visit: Payer: Self-pay | Admitting: Internal Medicine

## 2020-07-24 NOTE — Telephone Encounter (Signed)
Last filled 05-18-20 #180 Last OV 04-08-20 Next OV 04-13-21 [facility]

## 2020-11-12 ENCOUNTER — Other Ambulatory Visit: Payer: Self-pay | Admitting: Internal Medicine

## 2020-11-12 NOTE — Telephone Encounter (Signed)
Last written 07-26-20 #180 Last OV 04-08-20 Next OV 04-13-21 [facility]

## 2020-12-04 ENCOUNTER — Ambulatory Visit
Admission: EM | Admit: 2020-12-04 | Discharge: 2020-12-04 | Disposition: A | Discharge status: Home / Self Care | Payer: BC Managed Care – PPO | Attending: Emergency Medicine | Admitting: Emergency Medicine

## 2020-12-04 ENCOUNTER — Ambulatory Visit
Admission: RE | Admit: 2020-12-04 | Discharge: 2020-12-04 | Disposition: A | Discharge status: Home / Self Care | Payer: BC Managed Care – PPO | Source: Ambulatory Visit | Attending: Emergency Medicine | Admitting: Emergency Medicine

## 2020-12-04 ENCOUNTER — Other Ambulatory Visit: Payer: Self-pay

## 2020-12-04 DIAGNOSIS — R058 Other specified cough: Secondary | ICD-10-CM | POA: Diagnosis not present

## 2020-12-04 DIAGNOSIS — I10 Essential (primary) hypertension: Secondary | ICD-10-CM

## 2020-12-04 DIAGNOSIS — Z1152 Encounter for screening for COVID-19: Secondary | ICD-10-CM | POA: Diagnosis not present

## 2020-12-04 DIAGNOSIS — R059 Cough, unspecified: Secondary | ICD-10-CM | POA: Diagnosis not present

## 2020-12-04 MED ORDER — AZITHROMYCIN 250 MG PO TABS: 250.0000 mg | ORAL_TABLET | Freq: Every day | ORAL | 0 refills | Status: DC

## 2020-12-04 NOTE — ED Provider Notes (Signed)
[facility] Cower    CSN: [id number] Arrival date & time: 12/04/20  1145      History   Chief Complaint Chief Complaint  Patient presents with   Cough    HPI BRONISLAW SWITZER is a 45 y.o. male.  Patient presents with cough productive of yellow phlegm x10 days.  He also reports congestion and postnasal drip.  He denies fever, chills, shortness of breath, or other symptoms.  His medical history includes hypertension, OSA, allergic rhinitis, morbid obesity.  Current everyday smoker.   The history is provided by the patient and medical records.   Past Medical History:  Diagnosis Date   Allergic rhinitis    Anxiety    GERD (gastroesophageal reflux disease)    Hypertension    Obstructive sleep apnea     Patient Active Problem List   Diagnosis Date Noted   Numbness of left hand 04/08/2020   Trapezius strain, left, initial encounter 02/12/2020   Gout involving toe of right foot 08/23/2019   Cigarette smoker 06/03/2019   Tinea cruris 06/02/2016   Fatty liver 08/14/2013   Morbid obesity (HCC) 08/09/2012   Routine general medical examination at a health care facility 08/02/2011   Recurrent low back pain 02/01/2008   Essential hypertension, benign 08/02/2007   OBSTRUCTIVE SLEEP APNEA 07/26/2007   Anxiety 06/22/2006   Allergic rhinitis 06/22/2006   GERD 06/22/2006    Past Surgical History:  Procedure Laterality Date   LUMBAR DISC SURGERY  5/10   Dr. Franky Macho   LUMBAR DISC SURGERY  1/12   L4-5   LUMBAR LAMINECTOMY/DECOMPRESSION MICRODISCECTOMY Left 06/27/2019   Procedure: Left Lumbar Five Sacral One Microdiscectomy;  Surgeon: Coletta Memos, MD;  Location: [facility] OR;  Service: Neurosurgery;  Laterality: Left;  Posterior   NASAL SINUS SURGERY  10/10   Dr. Willeen Cass       Home Medications    Prior to Admission medications   Medication Sig Start Date End Date Taking? Authorizing Provider  azithromycin (ZITHROMAX) 250 MG tablet Take 1 tablet (250 mg total) by mouth daily.  Take first 2 tablets together, then 1 every day until finished. 12/04/20  Yes Mickie Bail, NP  chlorthalidone (HYGROTON) 25 MG tablet TAKE 1 TABLET BY MOUTH ONCE DAILY 07/21/20   Karie Schwalbe, MD  clonazePAM (KLONOPIN) 0.5 MG tablet TAKE 1 TABLET BY MOUTH TWICE A DAY AS NEEDED 11/12/20   Karie Schwalbe, MD  gabapentin (NEURONTIN) 300 MG capsule Take 600 mg by mouth at bedtime.  04/08/19   [provider]  losartan (COZAAR) 100 MG tablet TAKE 1 TABLET BY MOUTH ONCE DAILY EACH EVENING. 07/21/20   Karie Schwalbe, MD  metoprolol succinate (TOPROL-XL) 100 MG 24 hr tablet Take 1 tablet (100 mg total) by mouth daily. Take with or immediately following a meal. 12/20/19   Karie Schwalbe, MD  montelukast (SINGULAIR) 10 MG tablet TAKE 1 TABLET BY MOUTH EVERY NIGHT AT BEDTIME 07/21/20   Karie Schwalbe, MD    Family History Family History  Problem Relation Age of Onset   Cancer Mother        Breast    Social History Social History   Tobacco Use   Smoking status: Every Day    Packs/day: 2.00    Years: 17.00    Pack years: 34.00    Types: Cigarettes   Smokeless tobacco: Never  Substance Use Topics   Alcohol use: Yes    Alcohol/week: 6.0 standard drinks    Types:  6 Cans of beer per week    Comment: Quit after 3 DUI's.  Now drinks 6-8 at night when he doesn't have his daughter.   Drug use: Never     Allergies   Hydrocodone bit-homatrop mbr and Lisinopril   Review of Systems Review of Systems  Constitutional:  Negative for chills and fever.  HENT:  Positive for congestion, postnasal drip and rhinorrhea. Negative for ear pain and sore throat.   Respiratory:  Positive for cough. Negative for shortness of breath.   Cardiovascular:  Negative for chest pain and palpitations.  Gastrointestinal:  Negative for abdominal pain and vomiting.  Skin:  Negative for color change and rash.  All other systems reviewed and are negative.   Physical Exam Triage Vital Signs ED Triage  Vitals  Enc Vitals Group     BP      Pulse      Resp      Temp      Temp src      SpO2      Weight      Height      Head Circumference      Peak Flow      Pain Score      Pain Loc      Pain Edu?      Excl. in GC?    No data found.  Updated Vital Signs BP (!) 159/97   Pulse 71   Temp 98.6 F (37 C)   Resp 18   SpO2 95%   Visual Acuity Right Eye Distance:   Left Eye Distance:   Bilateral Distance:    Right Eye Near:   Left Eye Near:    Bilateral Near:     Physical Exam Vitals and nursing note reviewed.  Constitutional:      General: He is not in acute distress.    Appearance: He is well-developed. He is obese. He is not ill-appearing.  HENT:     Head: Normocephalic and atraumatic.     Right Ear: Tympanic membrane normal.     Left Ear: Tympanic membrane normal.     Nose: Congestion present.     Mouth/Throat:     Mouth: Mucous membranes are moist.     Pharynx: Oropharynx is clear.  Eyes:     Conjunctiva/sclera: Conjunctivae normal.  Cardiovascular:     Rate and Rhythm: Normal rate and regular rhythm.     Heart sounds: Normal heart sounds.  Pulmonary:     Effort: Pulmonary effort is normal. No respiratory distress.     Breath sounds: Rhonchi present.     Comments: Scattered rhonchi throughout. Abdominal:     Palpations: Abdomen is soft.     Tenderness: There is no abdominal tenderness.  Musculoskeletal:     Cervical back: Neck supple.  Skin:    General: Skin is warm and dry.  Neurological:     General: No focal deficit present.     Mental Status: He is alert and oriented to person, place, and time.     Gait: Gait normal.  Psychiatric:        Mood and Affect: Mood normal.        Behavior: Behavior normal.     UC Treatments / Results  Labs (all labs ordered are listed, but only abnormal results are displayed) Labs Reviewed  NOVEL CORONAVIRUS, NAA    EKG   Radiology DG Chest 2 View  Result Date: 12/04/2020 CLINICAL DATA:  Productive  cough, smoker EXAM:  CHEST - 2 VIEW COMPARISON:  07/17/2008 FINDINGS: The heart size and mediastinal contours are within normal limits. Both lungs are clear. The visualized skeletal structures are unremarkable. IMPRESSION: No active cardiopulmonary disease. Electronically Signed   By: Wiliam Ke M.D.   On: 12/04/2020 13:55    Procedures Procedures (including critical care time)  Medications Ordered in UC Medications - No data to display  Initial Impression / Assessment and Plan / UC Course  I have reviewed the triage vital signs and the nursing notes.  Pertinent labs & imaging results that were available during my care of the patient were reviewed by me and considered in my medical decision making (see chart for details).   Productive cough, acute bronchitis.  Elevated blood pressure with HTN.  CXR negative.  PCR COVID pending.  Treating with Zithromax.  Instructed patient to follow-up with his PCP next week for recheck.  Also discussed that his blood pressure is elevated today and needs to be rechecked by his PCP also.  Education provided on managing hypertension.  Patient agrees to plan of care.   Final Clinical Impressions(s) / UC Diagnoses   Final diagnoses:  Productive cough  Elevated blood pressure reading in office with diagnosis of hypertension     Discharge Instructions      Go to [facility] for your x-ray.    [address]  [address] [place], [place] [address] [phone]  I will call you with the results.  Take the Zithromax as directed.  Follow up with your primary care provider next week.    Your blood pressure is elevated today at 176/97.  Please have this rechecked by your primary care provider in 1-2 weeks.          ED Prescriptions     Medication Sig Dispense Auth. Provider   azithromycin (ZITHROMAX) 250 MG tablet Take 1 tablet (250 mg total) by mouth daily. Take first 2 tablets together, then 1 every day until  finished. 6 tablet Mickie Bail, NP      PDMP not reviewed this encounter.   Mickie Bail, NP 12/04/20 [phone]

## 2020-12-04 NOTE — Discharge Instructions (Addendum)
Go to [facility] for your x-ray.    [address]  [address] [place], [place] [address] [phone]  I will call you with the results.  Take the Zithromax as directed.  Follow up with your primary care provider next week.    Your blood pressure is elevated today at 176/97.  Please have this rechecked by your primary care provider in 1-2 weeks.

## 2020-12-04 NOTE — ED Triage Notes (Signed)
Pt presents with c/o cough for past 10 days

## 2020-12-06 LAB — NOVEL CORONAVIRUS, NAA: SARS-CoV-2, NAA: NOT DETECTED

## 2020-12-06 LAB — SARS-COV-2, NAA 2 DAY TAT

## 2020-12-12 ENCOUNTER — Other Ambulatory Visit: Payer: Self-pay | Admitting: Internal Medicine

## 2021-01-12 ENCOUNTER — Other Ambulatory Visit: Payer: Self-pay | Admitting: Internal Medicine

## 2021-02-03 ENCOUNTER — Other Ambulatory Visit: Payer: Self-pay | Admitting: Internal Medicine

## 2021-02-03 NOTE — Telephone Encounter (Signed)
Last written #180 11-12-20 Last OV 04-08-20 Next OV 04-13-21 [facility]

## 2021-03-09 IMAGING — CR DG LUMBAR SPINE 2-3V
3 series · 3 of 3 positions shown · non-contrast
Comparison: Plain films lumbar spine 03/31/2017.

CLINICAL DATA: Intraoperative imaging for localization in a patient
undergoing L5-S1 discectomy.

EXAM:
LUMBAR SPINE - 2-3 VIEW

[lateral (1 of 3)]
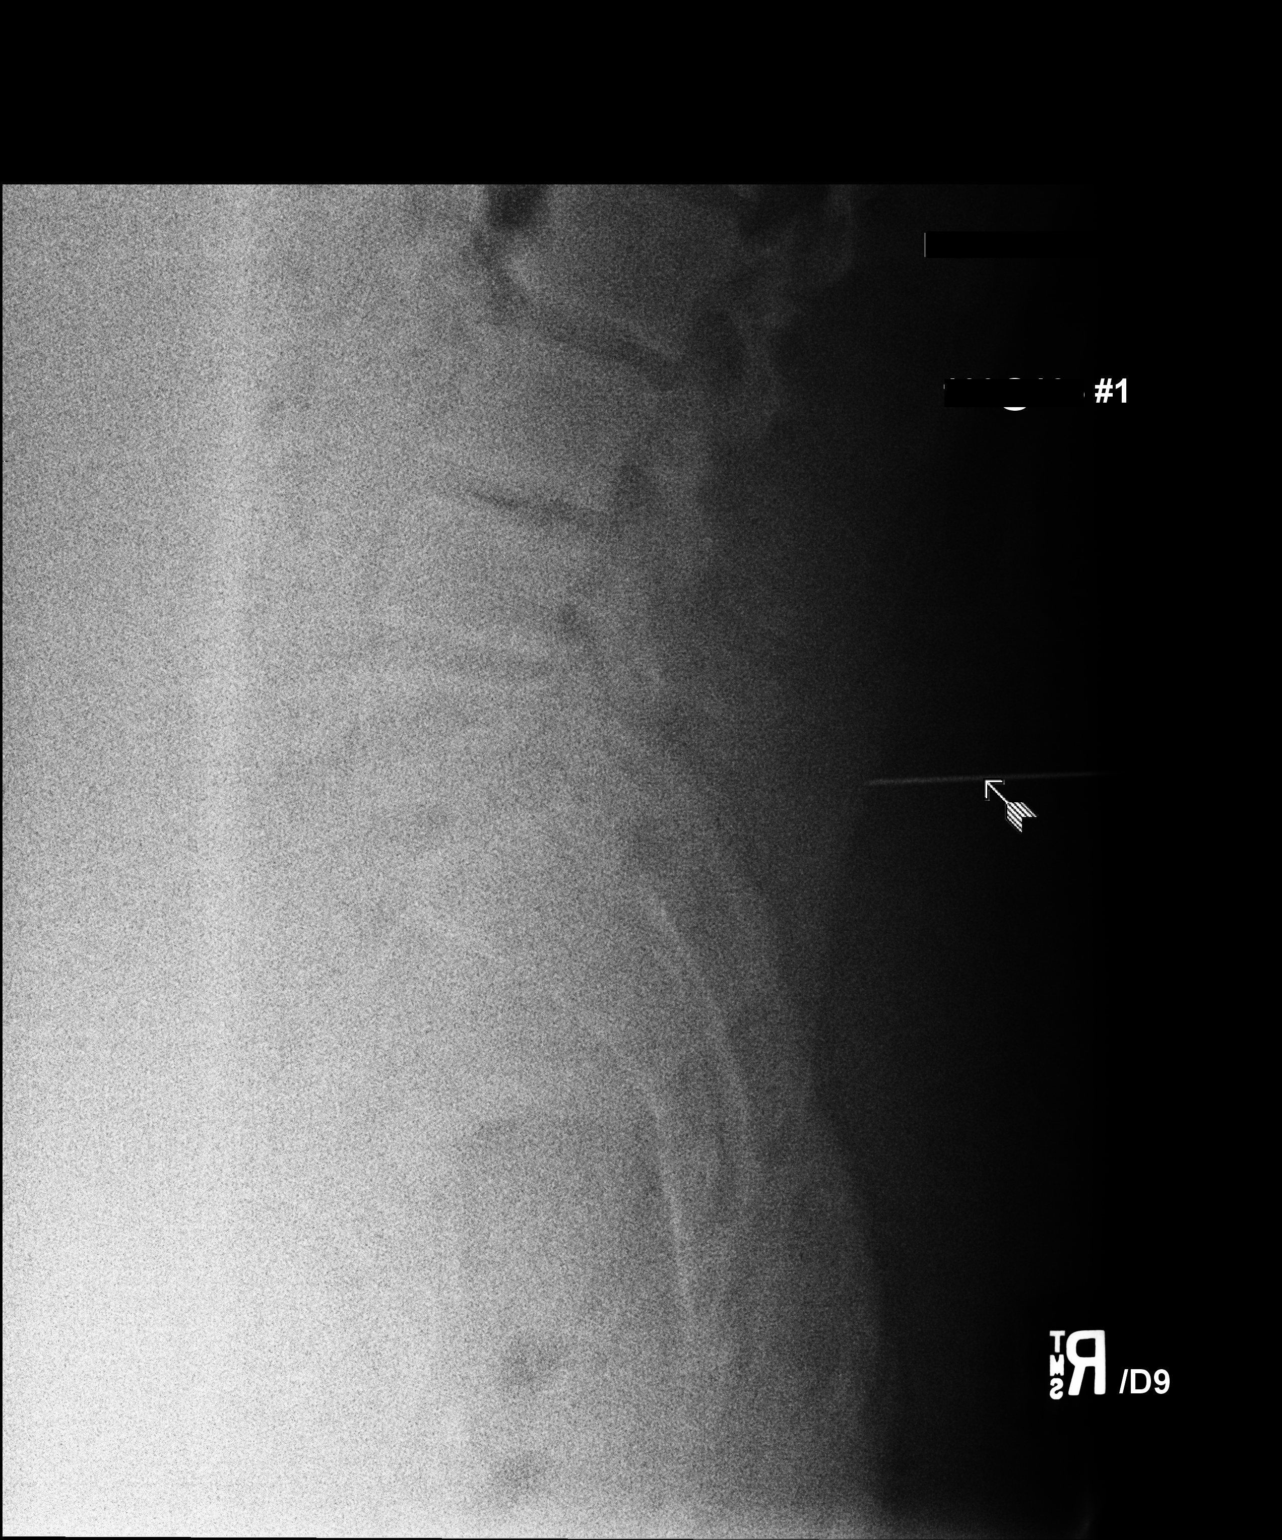

[lateral (2 of 3)]
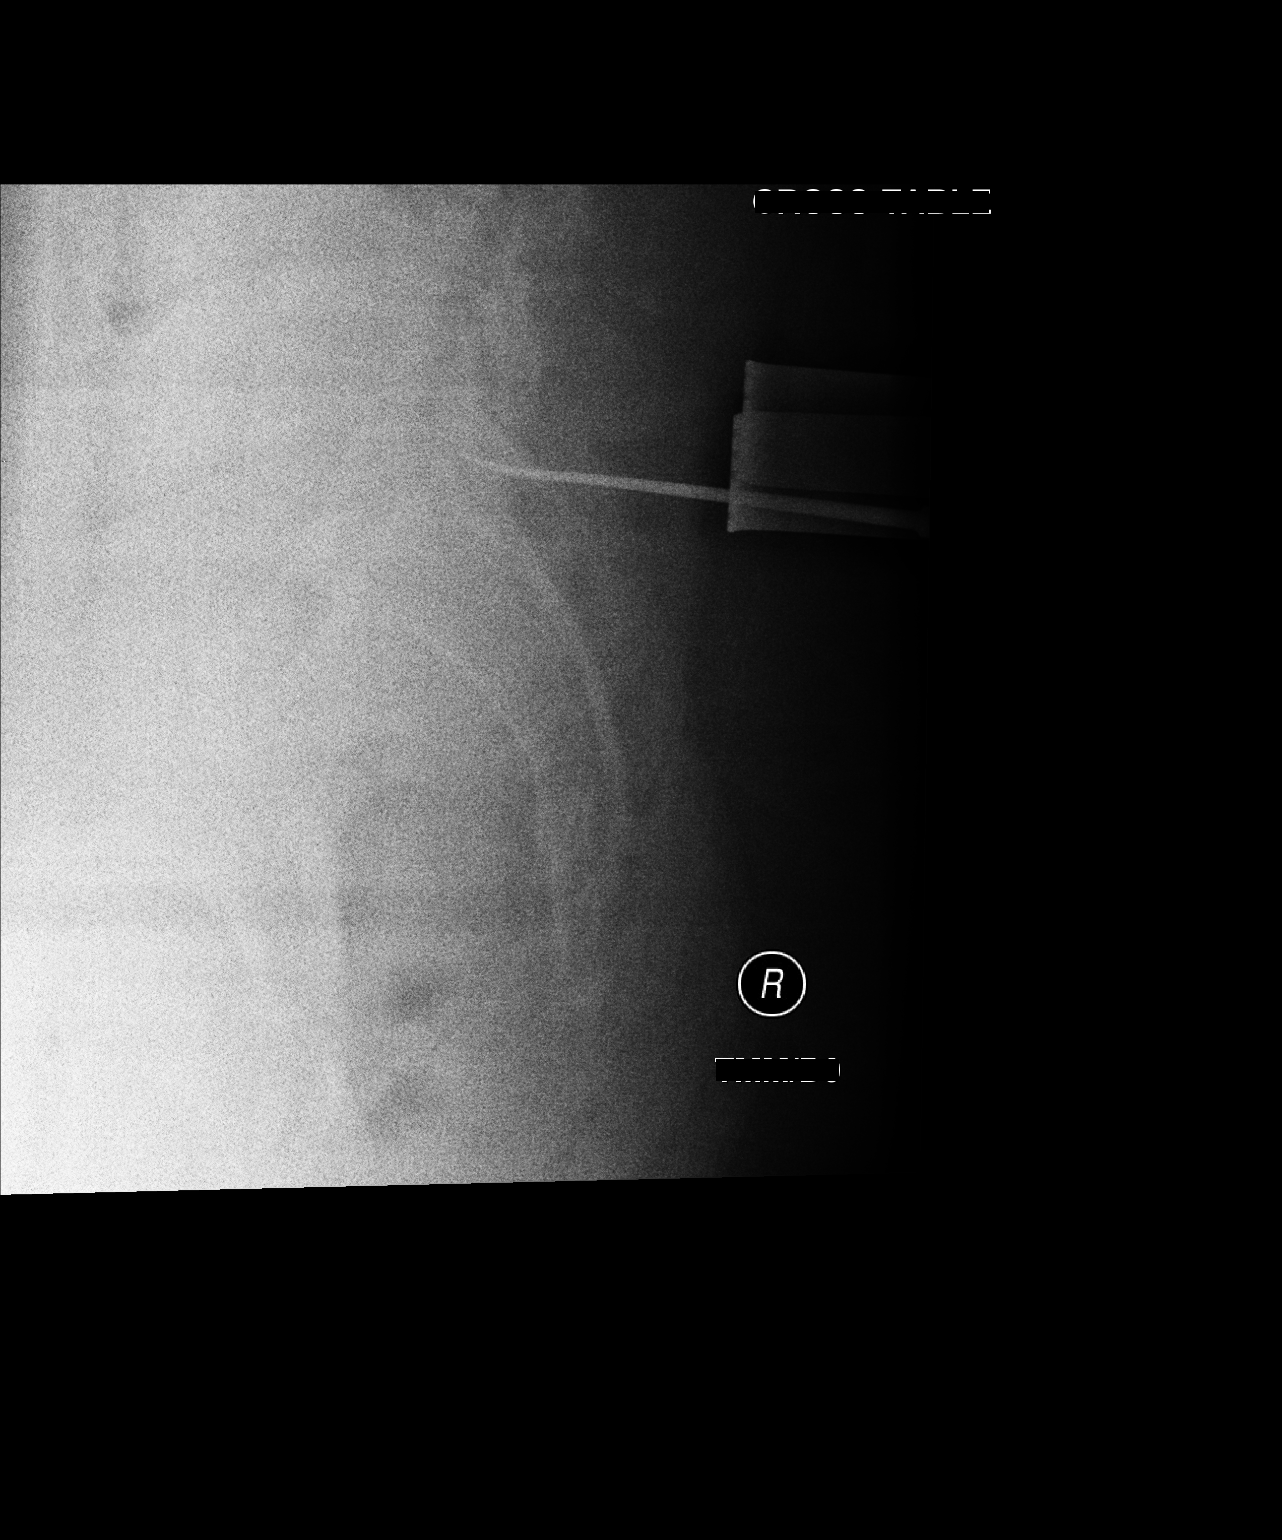

[lateral (3 of 3)]
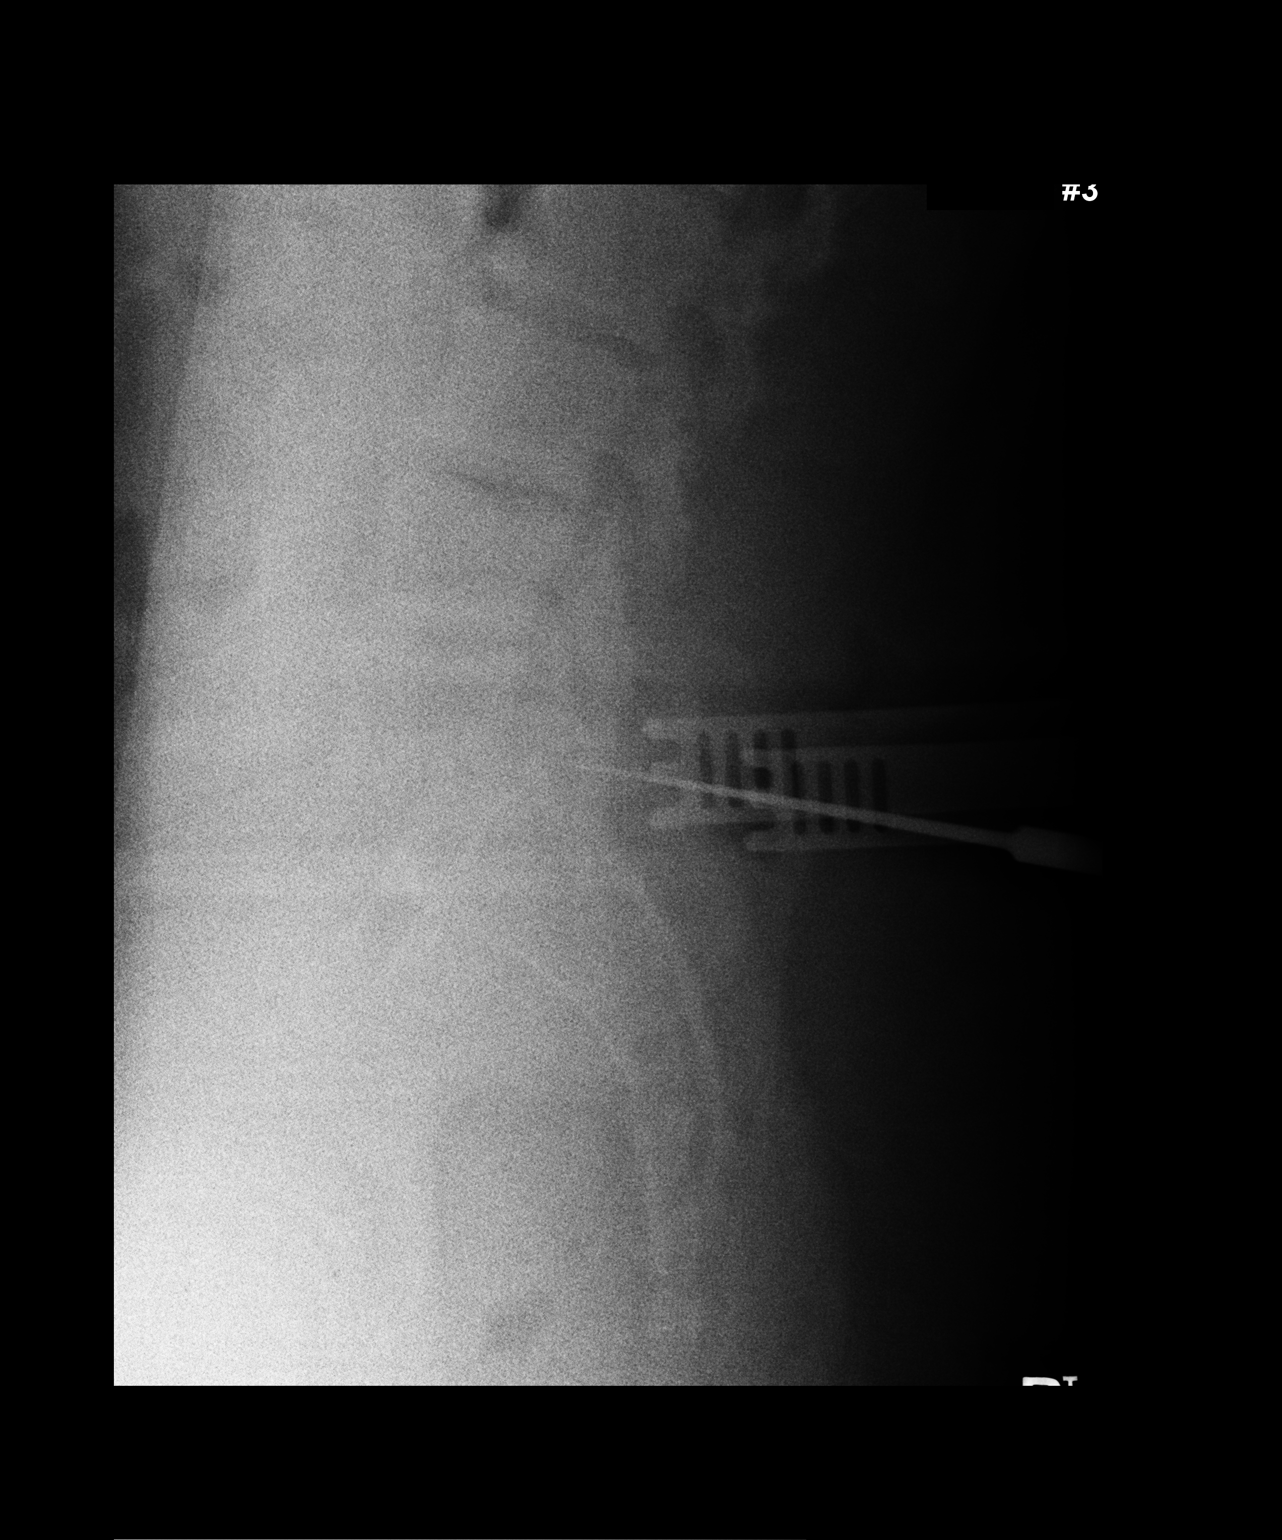

[3 of 3 positions shown; findings below may reference images not displayed]

FINDINGS: Three intraoperative views of the lumbar spine lateral projection
demonstrate localization of the L5-S1 level.
IMPRESSION: As above.

## 2021-03-17 ENCOUNTER — Other Ambulatory Visit: Payer: Self-pay

## 2021-03-17 ENCOUNTER — Ambulatory Visit (INDEPENDENT_AMBULATORY_CARE_PROVIDER_SITE_OTHER): Payer: BC Managed Care – PPO | Admitting: Internal Medicine

## 2021-03-17 ENCOUNTER — Encounter: Payer: Self-pay | Admitting: Internal Medicine

## 2021-03-17 DIAGNOSIS — J0100 Acute maxillary sinusitis, unspecified: Secondary | ICD-10-CM | POA: Insufficient documentation

## 2021-03-17 MED ORDER — PREDNISONE 20 MG PO TABS: 40.0000 mg | ORAL_TABLET | Freq: Every day | ORAL | 0 refills | Status: DC

## 2021-03-17 MED ORDER — AMOXICILLIN-POT CLAVULANATE 875-125 MG PO TABS: 1.0000 | ORAL_TABLET | Freq: Two times a day (BID) | ORAL | 0 refills | Status: DC

## 2021-03-17 NOTE — Progress Notes (Signed)
Subjective:    Patient ID: Juan Franklin, male    DOB: 06/19/1975, 45 y.o.   MRN: [id number]  HPI Here due to respiratory illness  1 week of ear aches, eyes matted in the morning and itchy Lots of nasal drainage and coughing stuff up Had sweat last night---?fever No SOB Frontal headache  Tried ibuprofen-- 5 at a time Not clearly helpful  Neti pot does get out purulent stuff  Current Outpatient Medications on File Prior to Visit  Medication Sig Dispense Refill   chlorthalidone (HYGROTON) 25 MG tablet TAKE 1 TABLET BY MOUTH ONCE DAILY 90 tablet 3   clonazePAM (KLONOPIN) 0.5 MG tablet TAKE 1 TABLET BY MOUTH TWICE A DAY AS NEEDED 180 tablet 0   gabapentin (NEURONTIN) 300 MG capsule Take 600 mg by mouth at bedtime.      losartan (COZAAR) 100 MG tablet TAKE 1 TABLET BY MOUTH ONCE DAILY EACH EVENING. 90 tablet 3   metoprolol succinate (TOPROL-XL) 100 MG 24 hr tablet TAKE 1 TABLET BY MOUTH ONCE A DAY WITH OR IMMEDIATELY FOLLOWING A MEAL 90 tablet 3   montelukast (SINGULAIR) 10 MG tablet TAKE 1 TABLET BY MOUTH EVERY NIGHT AT BEDTIME 90 tablet 3   No current facility-administered medications on file prior to visit.    Allergies  Allergen Reactions   Hydrocodone Bit-Homatrop Mbr Itching    Has tolerated vicodin in the past   Lisinopril Cough    Past Medical History:  Diagnosis Date   Allergic rhinitis    Anxiety    GERD (gastroesophageal reflux disease)    Hypertension    Obstructive sleep apnea     Past Surgical History:  Procedure Laterality Date   LUMBAR DISC SURGERY  5/10   Dr. Franky Macho   LUMBAR DISC SURGERY  1/12   L4-5   LUMBAR LAMINECTOMY/DECOMPRESSION MICRODISCECTOMY Left 06/27/2019   Procedure: Left Lumbar Five Sacral One Microdiscectomy;  Surgeon: Coletta Memos, MD;  Location: [facility] OR;  Service: Neurosurgery;  Laterality: Left;  Posterior   NASAL SINUS SURGERY  10/10   Dr. Willeen Cass    Family History  Problem Relation Age of Onset   Cancer Mother         Breast    Social History   Socioeconomic History   Marital status: Divorced    Spouse name: Not on file   Number of children: 1   Years of education: Not on file   Highest education level: Not on file  Occupational History   Occupation: Quality control    Comment: [employer]  Tobacco Use   Smoking status: Every Day    Packs/day: 2.00    Years: 17.00    Pack years: 34.00    Types: Cigarettes   Smokeless tobacco: Never  Substance and Sexual Activity   Alcohol use: Yes    Alcohol/week: 6.0 standard drinks    Types: 6 Cans of beer per week    Comment: Quit after 3 DUI's.  Now drinks 6-8 at night when he doesn't have his daughter.   Drug use: Never   Sexual activity: Not on file  Other Topics Concern   Not on file  Social History Narrative       Social Determinants of Health   Financial Resource Strain: Not on file  Food Insecurity: Not on file  Transportation Needs: Not on file  Physical Activity: Not on file  Stress: Not on file  Social Connections: Not on file  Intimate Partner Violence: Not on file  Review of Systems On vacation--so hasn't missed work No N/V Eating okay No sinus symptoms in years---since on singulair    Objective:   Physical Exam Constitutional:      Appearance: Normal appearance.  HENT:     Right Ear: Tympanic membrane and ear canal normal.     Left Ear: Ear canal normal.     Ears:     Comments: Fluid behind left TM---slight redness    Nose: No congestion.     Mouth/Throat:     Pharynx: No oropharyngeal exudate or posterior oropharyngeal erythema.  Pulmonary:     Effort: Pulmonary effort is normal.     Breath sounds: Normal breath sounds. No wheezing or rales.  Musculoskeletal:     Cervical back: Neck supple.  Lymphadenopathy:     Cervical: No cervical adenopathy.  Neurological:     Mental Status: He is alert.           Assessment & Plan:

## 2021-03-17 NOTE — Assessment & Plan Note (Signed)
Clear symptoms suggesting bacterial sinusitis Will give augmentin 3 days prednisone and try flonase analgesics

## 2021-03-25 ENCOUNTER — Telehealth: Payer: Self-pay | Admitting: Internal Medicine

## 2021-03-25 NOTE — Telephone Encounter (Signed)
Spoke to pt. Says he is feeling a little better but still blowing out green mucus from his nose. He uses [facility]. He is aware Dr Alphonsus Sias is out of the office this week.

## 2021-03-25 NOTE — Telephone Encounter (Signed)
Pt called stating that he seen Dr Alphonsus Sias on 03/17/21 and was prescribed antibiotics. Pt states that he still is having symptoms and would like another antibiotic called in. Please advise.

## 2021-03-26 NOTE — Telephone Encounter (Addendum)
Spoke to pt. Pt completed Augmentin and prednisone (only 3 day rx). He will go on [email or web address] if he feels he needs an appt.

## 2021-04-13 ENCOUNTER — Other Ambulatory Visit: Payer: Self-pay

## 2021-04-13 ENCOUNTER — Encounter: Payer: Self-pay | Admitting: Internal Medicine

## 2021-04-13 ENCOUNTER — Ambulatory Visit (INDEPENDENT_AMBULATORY_CARE_PROVIDER_SITE_OTHER): Payer: BC Managed Care – PPO | Admitting: Internal Medicine

## 2021-04-13 VITALS — BP 138/90 | HR 64 | Temp 97.0°F | Ht 72.25 in | Wt 370.0 lb

## 2021-04-13 DIAGNOSIS — Z0000 Encounter for general adult medical examination without abnormal findings: Secondary | ICD-10-CM | POA: Diagnosis not present

## 2021-04-13 DIAGNOSIS — J0100 Acute maxillary sinusitis, unspecified: Secondary | ICD-10-CM

## 2021-04-13 DIAGNOSIS — G4733 Obstructive sleep apnea (adult) (pediatric): Secondary | ICD-10-CM | POA: Diagnosis not present

## 2021-04-13 DIAGNOSIS — I10 Essential (primary) hypertension: Secondary | ICD-10-CM

## 2021-04-13 DIAGNOSIS — M1000 Idiopathic gout, unspecified site: Secondary | ICD-10-CM

## 2021-04-13 LAB — LIPID PANEL
Cholesterol: 182 mg/dL (ref 0–200)
HDL: 37.6 mg/dL — ABNORMAL LOW (ref >39.00)
NonHDL: 144.29
Total CHOL/HDL Ratio: 5
Triglycerides: 210 mg/dL — ABNORMAL HIGH (ref 0.0–149.0)
VLDL: 42 mg/dL — ABNORMAL HIGH (ref 0.0–40.0)

## 2021-04-13 LAB — CBC
HCT: 46.5 % (ref 39.0–52.0)
Hemoglobin: 15.5 g/dL (ref 13.0–17.0)
MCHC: 33.2 g/dL (ref 30.0–36.0)
MCV: 95 fl (ref 78.0–100.0)
Platelets: 241 10*3/uL (ref 150.0–400.0)
RBC: 4.9 Mil/uL (ref 4.22–5.81)
RDW: 13.7 % (ref 11.5–15.5)
WBC: 9.4 10*3/uL (ref 4.0–10.5)

## 2021-04-13 LAB — LDL CHOLESTEROL, DIRECT: Direct LDL: 112 mg/dL

## 2021-04-13 LAB — HEPATIC FUNCTION PANEL
ALT: 76 U/L — ABNORMAL HIGH (ref 0–53)
AST: 49 U/L — ABNORMAL HIGH (ref 0–37)
Albumin: 4.6 g/dL (ref 3.5–5.2)
Alkaline Phosphatase: 106 U/L (ref 39–117)
Bilirubin, Direct: 0.1 mg/dL (ref 0.0–0.3)
Total Bilirubin: 0.6 mg/dL (ref 0.2–1.2)
Total Protein: 7.4 g/dL (ref 6.0–8.3)

## 2021-04-13 LAB — RENAL FUNCTION PANEL
Albumin: 4.6 g/dL (ref 3.5–5.2)
BUN: 10 mg/dL (ref 6–23)
CO2: 30 mEq/L (ref 19–32)
Calcium: 9.3 mg/dL (ref 8.4–10.5)
Chloride: 97 mEq/L (ref 96–112)
Creatinine, Ser: 0.83 mg/dL (ref 0.40–1.50)
GFR: 105.95 mL/min (ref >60.00)
Glucose, Bld: 100 mg/dL — ABNORMAL HIGH (ref 70–99)
Phosphorus: 3.8 mg/dL (ref 2.3–4.6)
Potassium: 4.3 mEq/L (ref 3.5–5.1)
Sodium: 136 mEq/L (ref 135–145)

## 2021-04-13 LAB — T4, FREE: Free T4: 0.75 ng/dL (ref 0.60–1.60)

## 2021-04-13 MED ORDER — DOXYCYCLINE HYCLATE 100 MG PO TABS: 100.0000 mg | ORAL_TABLET | Freq: Two times a day (BID) | ORAL | 0 refills | Status: DC

## 2021-04-13 NOTE — Assessment & Plan Note (Signed)
Has lost weight Discussed lifestyle

## 2021-04-13 NOTE — Progress Notes (Signed)
Subjective:    Patient ID: Juan Franklin, male    DOB: 1975/11/24, 46 y.o.   MRN: [id number]  HPI Here for physical  Sinus symptoms are better but not gone Still with chest/head congestion Still coughing stuff up  Otherwise doing well Same job Has girlfriend---they spend most of their time together (but don't live together yet) Now only going to bar on weekends---less drinking Had been hiking during hunting season--not so much now  Sleeping okay Uses CPAP still Still smokes--not ready to stop (girlfriend also smokes)  Current Outpatient Medications on File Prior to Visit  Medication Sig Dispense Refill   chlorthalidone (HYGROTON) 25 MG tablet TAKE 1 TABLET BY MOUTH ONCE DAILY 90 tablet 3   clonazePAM (KLONOPIN) 0.5 MG tablet TAKE 1 TABLET BY MOUTH TWICE A DAY AS NEEDED 180 tablet 0   gabapentin (NEURONTIN) 300 MG capsule Take 600 mg by mouth at bedtime.      losartan (COZAAR) 100 MG tablet TAKE 1 TABLET BY MOUTH ONCE DAILY EACH EVENING. 90 tablet 3   metoprolol succinate (TOPROL-XL) 100 MG 24 hr tablet TAKE 1 TABLET BY MOUTH ONCE A DAY WITH OR IMMEDIATELY FOLLOWING A MEAL 90 tablet 3   montelukast (SINGULAIR) 10 MG tablet TAKE 1 TABLET BY MOUTH EVERY NIGHT AT BEDTIME 90 tablet 3   No current facility-administered medications on file prior to visit.    Allergies  Allergen Reactions   Hydrocodone Bit-Homatrop Mbr Itching    Has tolerated vicodin in the past   Lisinopril Cough    Past Medical History:  Diagnosis Date   Allergic rhinitis    Anxiety    GERD (gastroesophageal reflux disease)    Hypertension    Obstructive sleep apnea     Past Surgical History:  Procedure Laterality Date   LUMBAR DISC SURGERY  5/10   Dr. Franky Macho   LUMBAR DISC SURGERY  1/12   L4-5   LUMBAR LAMINECTOMY/DECOMPRESSION MICRODISCECTOMY Left 06/27/2019   Procedure: Left Lumbar Five Sacral One Microdiscectomy;  Surgeon: Coletta Memos, MD;  Location: [facility] OR;  Service: Neurosurgery;   Laterality: Left;  Posterior   NASAL SINUS SURGERY  10/10   Dr. Willeen Cass    Family History  Problem Relation Age of Onset   Cancer Mother        Breast    Social History   Socioeconomic History   Marital status: Divorced    Spouse name: Not on file   Number of children: 1   Years of education: Not on file   Highest education level: Not on file  Occupational History   Occupation: Quality control    Comment: Neurosurgeon company  Tobacco Use   Smoking status: Every Day    Packs/day: 2.00    Years: 17.00    Pack years: 34.00    Types: Cigarettes   Smokeless tobacco: Never  Substance and Sexual Activity   Alcohol use: Yes    Alcohol/week: 6.0 standard drinks    Types: 6 Cans of beer per week    Comment: Quit after 3 DUI's.  Now drinks 6-8 at night when he doesn't have his daughter.   Drug use: Never   Sexual activity: Not on file  Other Topics Concern   Not on file  Social History Narrative       Social Determinants of Health   Financial Resource Strain: Not on file  Food Insecurity: Not on file  Transportation Needs: Not on file  Physical Activity: Not on file  Stress: Not on file  Social Connections: Not on file  Intimate Partner Violence: Not on file   Review of Systems  Constitutional:  Negative for fatigue.       Has lost 30# in the past year Wears seat belt  HENT:  Positive for tinnitus. Negative for dental problem and hearing loss.        Overdue for dentist  Eyes:  Negative for visual disturbance.       No diplopia or unilateral vision loss  Respiratory:  Negative for chest tightness.        Cough with congestion lately Some SOB with chest "full of stuff"  Cardiovascular:  Negative for chest pain and leg swelling.       Some skips--not as common now (stress related)  Gastrointestinal:  Negative for blood in stool and constipation.       Small umbilical hernia No heartburn  Endocrine: Negative for polydipsia and polyuria.  Genitourinary:   Negative for difficulty urinating and urgency.       No sexual problems  Musculoskeletal:  Negative for arthralgias and joint swelling.       Some chronic low back pain  Skin:  Negative for rash.       No suspicious lesions  Allergic/Immunologic: Negative for environmental allergies and immunocompromised state.  Neurological:  Negative for dizziness, syncope and light-headedness.       Occ tension headaches--ibuprofen helps  Hematological:  Negative for adenopathy. Does not bruise/bleed easily.  Psychiatric/Behavioral:  Negative for dysphoric mood and sleep disturbance. The patient is not nervous/anxious.       Objective:   Physical Exam Constitutional:      Appearance: He is obese.  HENT:     Mouth/Throat:     Pharynx: No oropharyngeal exudate or posterior oropharyngeal erythema.  Eyes:     Conjunctiva/sclera: Conjunctivae normal.     Pupils: Pupils are equal, round, and reactive to light.  Cardiovascular:     Rate and Rhythm: Normal rate and regular rhythm.     Pulses: Normal pulses.     Heart sounds: No murmur heard.   No gallop.  Pulmonary:     Effort: Pulmonary effort is normal.     Breath sounds: Normal breath sounds. No wheezing or rales.  Abdominal:     Palpations: Abdomen is soft.     Tenderness: There is no abdominal tenderness.  Musculoskeletal:     Cervical back: Neck supple.     Right lower leg: No edema.     Left lower leg: No edema.  Lymphadenopathy:     Cervical: No cervical adenopathy.  Skin:    Findings: No lesion or rash.  Neurological:     General: No focal deficit present.     Mental Status: He is alert and oriented to person, place, and time.  Psychiatric:        Mood and Affect: Mood normal.        Behavior: Behavior normal.           Assessment & Plan:

## 2021-04-13 NOTE — Assessment & Plan Note (Signed)
Healthy Working on [employer] loss---less beer, etc Discussed DASH eating---exercise Needs to stop smoking--but not ready (girlfriend does also) Colon cancer screening--he does not want this yet Not old enough for PSA Doesn't want COVID or flu vaccines

## 2021-04-13 NOTE — Assessment & Plan Note (Signed)
Persistent symptoms Will try doxy

## 2021-04-13 NOTE — Assessment & Plan Note (Signed)
Sleeps with CPAP nightly 

## 2021-04-13 NOTE — Assessment & Plan Note (Signed)
No recent symptoms with cherry tablet

## 2021-04-13 NOTE — Patient Instructions (Signed)

## 2021-04-13 NOTE — Assessment & Plan Note (Signed)
BP Readings from Last 3 Encounters:  04/13/21 138/90  03/17/21 122/80  12/04/20 (!) 159/97   Okay on losartan, chlorthalidone and metoprolol Will check labs

## 2021-05-03 ENCOUNTER — Other Ambulatory Visit: Payer: Self-pay | Admitting: Internal Medicine

## 2021-05-03 NOTE — Telephone Encounter (Signed)
Last filled 02-04-21 #180 Last OV 04-13-21 Next OV 04-15-22 [facility]

## 2021-07-28 ENCOUNTER — Other Ambulatory Visit: Payer: Self-pay | Admitting: Internal Medicine

## 2021-07-29 NOTE — Telephone Encounter (Signed)
Last filled 05-03-21 #180 ?Last OV 04-13-21 ?Next OV 04-15-22 ?[facility] ?

## 2021-08-05 ENCOUNTER — Other Ambulatory Visit: Payer: Self-pay | Admitting: Internal Medicine

## 2021-09-08 ENCOUNTER — Other Ambulatory Visit: Payer: Self-pay | Admitting: Internal Medicine

## 2021-09-08 DIAGNOSIS — J309 Allergic rhinitis, unspecified: Secondary | ICD-10-CM

## 2021-09-22 ENCOUNTER — Ambulatory Visit (INDEPENDENT_AMBULATORY_CARE_PROVIDER_SITE_OTHER): Payer: BC Managed Care – PPO | Admitting: Internal Medicine

## 2021-09-22 ENCOUNTER — Encounter: Payer: Self-pay | Admitting: Internal Medicine

## 2021-09-22 DIAGNOSIS — B356 Tinea cruris: Secondary | ICD-10-CM

## 2021-09-22 MED ORDER — FLUCONAZOLE 150 MG PO TABS: 150.0000 mg | ORAL_TABLET | ORAL | 6 refills | Status: DC

## 2021-09-22 MED ORDER — AMOXICILLIN-POT CLAVULANATE 875-125 MG PO TABS: 1.0000 | ORAL_TABLET | Freq: Two times a day (BID) | ORAL | 2 refills | Status: DC

## 2021-09-22 MED ORDER — NYSTATIN 100000 UNIT/GM EX CREA: 1.0000 | TOPICAL_CREAM | Freq: Two times a day (BID) | CUTANEOUS | 6 refills | Status: DC | PRN

## 2021-09-22 MED ORDER — GABAPENTIN 300 MG PO CAPS: 600.0000 mg | ORAL_CAPSULE | Freq: Every day | ORAL | 3 refills | Status: DC

## 2021-09-22 NOTE — Assessment & Plan Note (Addendum)
Worse Will change to nystatin cream--worked better Weekly fluconazole 150 prn  Will give augmentin for prn use for recurrent boils

## 2021-09-22 NOTE — Progress Notes (Signed)
Subjective:    Patient ID: Juan Franklin, male    DOB: [birth year], 46 y.o.   MRN: [id number]  HPI Here due to worsening problems with jock itch  Hadn't had trouble in a long time Had left over ketoconazole---started 10 days ago Helps a little--but comes right back Uses [employer] powder for the moisture  Had some inflammation on scrotum--did get better now  Also with recurrent boils and drainage in upper thighs  Current Outpatient Medications on File Prior to Visit  Medication Sig Dispense Refill   chlorthalidone (HYGROTON) 25 MG tablet TAKE 1 TABLET BY MOUTH ONCE DAILY 90 tablet 3   clonazePAM (KLONOPIN) 0.5 MG tablet TAKE 1 TABLET BY MOUTH TWICE A DAY AS NEEDED 180 tablet 0   gabapentin (NEURONTIN) 300 MG capsule Take 600 mg by mouth at bedtime.      losartan (COZAAR) 100 MG tablet TAKE 1 TABLET BY MOUTH ONCE DAILY EACH EVENING. 90 tablet 3   metoprolol succinate (TOPROL-XL) 100 MG 24 hr tablet TAKE 1 TABLET BY MOUTH ONCE A DAY WITH OR IMMEDIATELY FOLLOWING A MEAL 90 tablet 3   montelukast (SINGULAIR) 10 MG tablet TAKE 1 TABLET BY MOUTH EVERY NIGHT AT BEDTIME 90 tablet 1   No current facility-administered medications on file prior to visit.    Allergies  Allergen Reactions   Hydrocodone Bit-Homatrop Mbr Itching    Has tolerated vicodin in the past   Lisinopril Cough    Past Medical History:  Diagnosis Date   Allergic rhinitis    Anxiety    GERD (gastroesophageal reflux disease)    Hypertension    Obstructive sleep apnea     Past Surgical History:  Procedure Laterality Date   LUMBAR DISC SURGERY  5/10   Dr. Franky Macho   LUMBAR DISC SURGERY  1/12   L4-5   LUMBAR LAMINECTOMY/DECOMPRESSION MICRODISCECTOMY Left 06/27/2019   Procedure: Left Lumbar Five Sacral One Microdiscectomy;  Surgeon: Coletta Memos, MD;  Location: [facility];  Service: Neurosurgery;  Laterality: Left;  Posterior   NASAL SINUS SURGERY  10/10   Dr. Willeen Cass    Family History  Problem Relation Age of  Onset   Cancer Mother        Breast    Social History   Socioeconomic History   Marital status: Divorced    Spouse name: Not on file   Number of children: 1   Years of education: Not on file   Highest education level: Not on file  Occupational History   Occupation: Quality control    Comment: Neurosurgeon [employer]  Tobacco Use   Smoking status: Every Day    Packs/day: 2.00    Years: 17.00    Total pack years: 34.00    Types: Cigarettes    Passive exposure: Current   Smokeless tobacco: Never  Substance and Sexual Activity   Alcohol use: Yes    Alcohol/week: 6.0 standard drinks of alcohol    Types: 6 Cans of beer per week    Comment: Quit after 3 DUI's.  Now drinks 6-8 at night when he doesn't have his daughter.   Drug use: Never   Sexual activity: Not on file  Other Topics Concern   Not on file  Social History Narrative       Social Determinants of Health   Financial Resource Strain: Not on file  Food Insecurity: Not on file  Transportation Needs: Not on file  Physical Activity: Not on file  Stress: Not on file  Social Connections: Not on file  Intimate Partner Violence: Not on file   Review of Systems Got gabapentin from Dr Carmell [place] neuropathy in feet Hadn't used for a while---now back on it     Objective:   Physical Exam Constitutional:      Appearance: Normal appearance.  Skin:    Comments: Crural and inguinal redness without ulceration Some cysts and other irritation between upper thighs  Neurological:     Mental Status: He is alert.            Assessment & Plan:

## 2021-10-25 ENCOUNTER — Other Ambulatory Visit: Payer: Self-pay | Admitting: Internal Medicine

## 2021-10-25 NOTE — Telephone Encounter (Signed)
Last filled 07-29-21 #180 Last OV 04-13-21 Next OV 04-15-22 [facility]

## 2021-11-25 ENCOUNTER — Ambulatory Visit (INDEPENDENT_AMBULATORY_CARE_PROVIDER_SITE_OTHER): Payer: BC Managed Care – PPO | Admitting: Nurse Practitioner

## 2021-11-25 ENCOUNTER — Encounter: Payer: Self-pay | Admitting: Nurse Practitioner

## 2021-11-25 VITALS — BP 130/88 | HR 72 | Temp 96.0°F | Resp 12 | Ht 71.0 in | Wt 372.0 lb

## 2021-11-25 DIAGNOSIS — M545 Low back pain, unspecified: Secondary | ICD-10-CM | POA: Diagnosis not present

## 2021-11-25 DIAGNOSIS — M6289 Other specified disorders of muscle: Secondary | ICD-10-CM | POA: Diagnosis not present

## 2021-11-25 DIAGNOSIS — Z9889 Other specified postprocedural states: Secondary | ICD-10-CM | POA: Insufficient documentation

## 2021-11-25 MED ORDER — PREDNISONE 20 MG PO TABS: ORAL_TABLET | ORAL | 0 refills | Status: AC

## 2021-11-25 MED ORDER — TIZANIDINE HCL 2 MG PO TABS: 2.0000 mg | ORAL_TABLET | Freq: Two times a day (BID) | ORAL | 0 refills | Status: DC | PRN

## 2021-11-25 NOTE — Patient Instructions (Signed)
Nice to see you today I sent in some steroids and muscle relaxer's to the pharmacy. They can make you sleepy Follow up if no improvement

## 2021-11-25 NOTE — Assessment & Plan Note (Signed)
States he has tried cyclobenzaprine in the past without effect.  We will try tizanidine 2 mg twice daily as needed.  Sedation precautions reviewed

## 2021-11-25 NOTE — Assessment & Plan Note (Signed)
Patient states he has several back surgeries in the past.  Did discuss getting imaging in office.  After joint discussion we will defer for now.  Take the conservative route if treatment does not yield improvement consider lumbar imaging

## 2021-11-25 NOTE — Assessment & Plan Note (Signed)
Patient has long periods of remission and then will have flareups.  This was related to movement muscular in nature.  We will start patient on prednisone 20 mg taper as directed.  Prednisone precautions reviewed inclusive of avoiding NSAIDs.  Follow-up if no improvement defer imaging currently

## 2021-11-25 NOTE — Progress Notes (Signed)
Acute Office Visit  Subjective:     Patient ID: Juan Franklin, male    DOB: [birth year], 46 y.o.   MRN: [id number]  Chief Complaint  Patient presents with   Back Pain    2 weeks ago patient was working on the stove and had to reach over the top of the stove to unplug it and when he did that and then slid back he got a shooting/lightning sensation across mid of his back, same area as previous back surgery. Feels like muscle pain now and moves from right to the left side. He did take Gabapentin 300 mg 2 tablets and it did not help     Patient is in today for back pain   States that he was working on  a stove and when he was coming out from it he felt a lightening bolt. No new numbness or tingling. No new weakness States that rest helps and movement makes it worse. States that it feels like a muscle spasm. States that he has tried advil and gabapentin and no relief   Patient has had history of several back surgeries.  Review of Systems  Constitutional:  Negative for chills and fever.  Musculoskeletal:  Positive for back pain.  Neurological:  Negative for tingling and weakness.        Objective:    BP 130/88   Pulse 72   Temp (!) 96 F (35.6 C)   Resp 12   Ht 5\' 11"  (1.803 m)   Wt (!) 372 lb (168.7 kg)   SpO2 94%   BMI 51.88 kg/m    Physical Exam Vitals and nursing note reviewed.  Constitutional:      Appearance: Normal appearance. He is obese.  Cardiovascular:     Rate and Rhythm: Normal rate and regular rhythm.     Heart sounds: Normal heart sounds.  Pulmonary:     Effort: Pulmonary effort is normal.     Breath sounds: Normal breath sounds.  Musculoskeletal:     Lumbar back: No tenderness or bony tenderness. Positive left straight leg raise test. Negative right straight leg raise test.       Back:  Neurological:     Mental Status: He is alert.     Deep Tendon Reflexes:     Reflex Scores:      Patellar reflexes are 1+ on the right side and 1+ on the left  side.    Comments: Bilateral lower extremity strength 5/5     No results found for any visits on 11/25/21.      Assessment & Plan:   Problem List Items Addressed This Visit       Other   Lumbar pain - Primary    Patient has long periods of remission and then will have flareups.  This was related to movement muscular in nature.  We will start patient on prednisone 20 mg taper as directed.  Prednisone precautions reviewed inclusive of avoiding NSAIDs.  Follow-up if no improvement defer imaging currently      Relevant Medications   predniSONE (DELTASONE) 20 MG tablet   tiZANidine (ZANAFLEX) 2 MG tablet   History of lumbar surgery    Patient states he has several back surgeries in the past.  Did discuss getting imaging in office.  After joint discussion we will defer for now.  Take the conservative route if treatment does not yield improvement consider lumbar imaging      Muscle tightness    States he  has tried cyclobenzaprine in the past without effect.  We will try tizanidine 2 mg twice daily as needed.  Sedation precautions reviewed      Relevant Medications   tiZANidine (ZANAFLEX) 2 MG tablet    Meds ordered this encounter  Medications   predniSONE (DELTASONE) 20 MG tablet    Sig: Take 1 tablet (20 mg total) by mouth 2 (two) times daily with a meal for 3 days, THEN 1 tablet (20 mg total) daily with breakfast for 3 days. Avoid NSAIDS like ibuprofen, motrin, aleve, naproxen, BC/Goody powders.    Dispense:  9 tablet    Refill:  0    Order Specific Question:   Supervising Provider    Answer:   Milinda Antis, MARNE A [[id number]]   tiZANidine (ZANAFLEX) 2 MG tablet    Sig: Take 1 tablet (2 mg total) by mouth 2 (two) times daily as needed for muscle spasms.    Dispense:  30 tablet    Refill:  0    Order Specific Question:   Supervising Provider    Answer:   TOWER, MARNE A [[id number]]    Return if symptoms worsen or fail to improve.  Audria Nine, NP

## 2021-12-07 ENCOUNTER — Other Ambulatory Visit: Payer: Self-pay | Admitting: Family Medicine

## 2021-12-07 DIAGNOSIS — M5136 Other intervertebral disc degeneration, lumbar region: Secondary | ICD-10-CM | POA: Diagnosis not present

## 2021-12-07 DIAGNOSIS — M5126 Other intervertebral disc displacement, lumbar region: Secondary | ICD-10-CM | POA: Diagnosis not present

## 2021-12-07 DIAGNOSIS — M5416 Radiculopathy, lumbar region: Secondary | ICD-10-CM | POA: Diagnosis not present

## 2021-12-07 DIAGNOSIS — M48061 Spinal stenosis, lumbar region without neurogenic claudication: Secondary | ICD-10-CM | POA: Diagnosis not present

## 2021-12-07 DIAGNOSIS — R0781 Pleurodynia: Secondary | ICD-10-CM | POA: Diagnosis not present

## 2021-12-07 DIAGNOSIS — M47816 Spondylosis without myelopathy or radiculopathy, lumbar region: Secondary | ICD-10-CM | POA: Diagnosis not present

## 2021-12-07 DIAGNOSIS — M47814 Spondylosis without myelopathy or radiculopathy, thoracic region: Secondary | ICD-10-CM | POA: Diagnosis not present

## 2021-12-07 DIAGNOSIS — M51369 Other intervertebral disc degeneration, lumbar region without mention of lumbar back pain or lower extremity pain: Secondary | ICD-10-CM

## 2021-12-07 DIAGNOSIS — M4699 Unspecified inflammatory spondylopathy, multiple sites in spine: Secondary | ICD-10-CM | POA: Diagnosis not present

## 2021-12-07 HISTORY — DX: Other intervertebral disc degeneration, lumbar region: M51.36

## 2021-12-07 HISTORY — DX: Other intervertebral disc degeneration, lumbar region without mention of lumbar back pain or lower extremity pain: M51.369

## 2021-12-13 ENCOUNTER — Ambulatory Visit
Admission: RE | Admit: 2021-12-13 | Discharge: 2021-12-13 | Disposition: A | Discharge status: Home / Self Care | Payer: BC Managed Care – PPO | Source: Ambulatory Visit | Attending: Family Medicine | Admitting: Family Medicine

## 2021-12-13 DIAGNOSIS — M5416 Radiculopathy, lumbar region: Secondary | ICD-10-CM

## 2021-12-13 DIAGNOSIS — M545 Low back pain, unspecified: Secondary | ICD-10-CM | POA: Diagnosis not present

## 2021-12-13 DIAGNOSIS — M48061 Spinal stenosis, lumbar region without neurogenic claudication: Secondary | ICD-10-CM | POA: Diagnosis not present

## 2021-12-21 DIAGNOSIS — M5126 Other intervertebral disc displacement, lumbar region: Secondary | ICD-10-CM | POA: Diagnosis not present

## 2021-12-21 DIAGNOSIS — Z6841 Body Mass Index (BMI) 40.0 and over, adult: Secondary | ICD-10-CM | POA: Diagnosis not present

## 2021-12-23 ENCOUNTER — Other Ambulatory Visit: Payer: Self-pay | Admitting: Neurosurgery

## 2021-12-29 ENCOUNTER — Encounter (HOSPITAL_COMMUNITY): Payer: Self-pay | Admitting: Neurosurgery

## 2021-12-29 ENCOUNTER — Other Ambulatory Visit: Payer: Self-pay

## 2021-12-29 NOTE — Progress Notes (Signed)
PCP - Dr Viviana Simpler Cardiologist - n/a  Chest x-ray - 12/04/20 EKG - DOS Stress Test - n/a ECHO - n/a Cardiac Cath - n/a  ICD Pacemaker/Loop - n/a  Sleep Study -  Yes CPAP - use nightly  ERAS: Clear liquids til 11:15 AM DOS.  Anesthesia review: Yes  STOP now taking any Aspirin (unless otherwise instructed by your surgeon), Aleve, Naproxen, Ibuprofen, Motrin, Advil, Goody's, BC's, all herbal medications, fish oil, and all vitamins.   Coronavirus Screening Do you have any of the following symptoms:  Cough yes/no: No Fever (>100.17F)  yes/no: No Runny nose Yes Sore throat yes/no: No Difficulty breathing/shortness of breath  yes/no: No  Have you traveled in the last 14 days and where? yes/no: No  Patient verbalized understanding of instructions that were given via phone.

## 2021-12-29 NOTE — Progress Notes (Signed)
Anesthesia Chart Review: SAME DAY WORK-UP  Case: [id number] Date/Time: 12/30/21 1400   Procedure: Right L34 Microdiscectomy (Right) - RM 18/3C// To Follow in rm 18   Anesthesia type: General   Pre-op diagnosis: Other intervertebral disc displacement, Lumbar region   Location: [facility] ROOM 18 / [facility]   Surgeons: Coletta Memos, MD       DISCUSSION: Patient is a 46 year old male scheduled for the above procedure.  History includes smoking, HTN, ADHD, anxiety, GERD, OSA (uses CPAP), sinus surgery (2010), spinal surgery (right L3-4 laminectomy/discectomy 07/23/08; L5-S1 microdiscectomy 06/27/19), morbid obesity.  He is a same-day work-up, so labs and EKG on arrival was indicated.  Anesthesia team to evaluate on the day of surgery.   VS: Ht 5\' 11"  (1.803 m)   Wt (!) 168.7 kg   BMI 51.88 kg/m  BP Readings from Last 3 Encounters:  11/25/21 130/88  09/22/21 140/90  04/13/21 138/90   Pulse Readings from Last 3 Encounters:  11/25/21 72  09/22/21 62  04/13/21 64    PROVIDERS: 04/15/21, MD is PCP    LABS: For labs on arrival as indicated.  Lab trends in [facility] over the past 3 years show mild elevation of LFTs. AST 34-49, ALT 50-76.  Normal CBC, Cr 0.83, and glucose 100 on 04/13/21.    IMAGES: MRI L-spine 12/13/21: IMPRESSION: 1. Increased degenerative appearing posterior endplate marrow edema at L3-L4 along with progressed right paracentral and caudal disc herniation (up to 13 mm long axis disc fragment). Chronic postoperative changes at that level with increased mild spinal and moderate to severe right lateral recess stenosis. Query right L4 radiculitis.   2. L5-S1 disc and endplate degeneration with bulky left lateral recess component, although suspected new postoperative architectural distortion there since 2021. Difficult to exclude residual stenosis at the descending left S1 nerve level. No significant spinal stenosis there and chronic moderate to severe left L5  foraminal stenosis is stable.   3. L4-L5 degeneration and postoperative changes with no spinal or lateral recess stenosis. Moderate multifactorial L4 foraminal stenosis appears increased and greater on the left.   4. Stable borderline to mild spinal stenosis at L2-L3 in part due to epidural lipomatosis. Chronic left paracentral disc herniation at T11-T12 with no definite lower thoracic spinal stenosis.    EKG: Last EKG seen is greater than 1 year ago.  06/25/2019: Normal sinus rhythm, minimal voltage criteria for LVH.   CV:  Past Medical History:  Diagnosis Date   ADHD (attention deficit hyperactivity disorder)    Allergic rhinitis    Anxiety    DDD (degenerative disc disease), lumbar 12/07/2021   in [facility]   GERD (gastroesophageal reflux disease)    HNP (herniated nucleus pulposus), lumbar 05/06/2019   in [facility]   Hypertension    Obstructive sleep apnea    uses cpap    Past Surgical History:  Procedure Laterality Date   LUMBAR DISC SURGERY  07/26/2008   Dr. 09/25/2008   LUMBAR DISC SURGERY  03/28/2010   L4-5   LUMBAR LAMINECTOMY/DECOMPRESSION MICRODISCECTOMY Left 06/27/2019   Procedure: Left Lumbar Five Sacral One Microdiscectomy;  Surgeon: 08/27/2019, MD;  Location: [facility] OR;  Service: Neurosurgery;  Laterality: Left;  Posterior   NASAL SINUS SURGERY  12/26/2008   Dr. 02/25/2009   WISDOM TOOTH EXTRACTION     at age 72 yrs old    MEDICATIONS: No current facility-administered medications for this encounter.    chlorthalidone (HYGROTON) 25 MG tablet   clonazePAM (KLONOPIN) 0.5  MG tablet   gabapentin (NEURONTIN) 300 MG capsule   losartan (COZAAR) 100 MG tablet   metoprolol succinate (TOPROL-XL) 100 MG 24 hr tablet   Misc Natural Products (ELDERBERRY IMMUNE COMPLEX PO)   montelukast (SINGULAIR) 10 MG tablet   nystatin cream (MYCOSTATIN)   OVER THE COUNTER MEDICATION   OVER THE COUNTER MEDICATION   oxyCODONE (OXY IR/ROXICODONE) 5 MG immediate release tablet   tiZANidine  (ZANAFLEX) 2 MG tablet    Myra Gianotti, PA-C Surgical Short Stay/Anesthesiology [facility] Phone [phone] [facility] Phone [phone] 12/29/2021 12:28 PM

## 2021-12-29 NOTE — Anesthesia Preprocedure Evaluation (Addendum)
Anesthesia Evaluation  Patient identified by MRN, date of birth, ID band Patient awake    Reviewed: Allergy & Precautions, NPO status , Patient's Chart, lab work & pertinent test results  Airway Mallampati: II  TM Distance: >3 FB     Dental  (+) Dental Advisory Given   Pulmonary sleep apnea , Current Smoker,    breath sounds clear to auscultation       Cardiovascular hypertension, Pt. on medications and Pt. on home beta blockers  Rhythm:Regular Rate:Normal     Neuro/Psych  Neuromuscular disease    GI/Hepatic Neg liver ROS, GERD  ,  Endo/Other  negative endocrine ROS  Renal/GU negative Renal ROS     Musculoskeletal  (+) Arthritis ,   Abdominal   Peds  Hematology negative hematology ROS (+)   Anesthesia Other Findings   Reproductive/Obstetrics                            Anesthesia Physical Anesthesia Plan  ASA: 3  Anesthesia Plan: General   Post-op Pain Management: Tylenol PO (pre-op)* and Toradol IV (intra-op)*   Induction: Intravenous  PONV Risk Score and Plan: 1 and Dexamethasone, Ondansetron and Treatment may vary due to age or medical condition  Airway Management Planned: Oral ETT  Additional Equipment:   Intra-op Plan:   Post-operative Plan: Extubation in OR  Informed Consent: I have reviewed the patients History and Physical, chart, labs and discussed the procedure including the risks, benefits and alternatives for the proposed anesthesia with the patient or authorized representative who has indicated his/her understanding and acceptance.       Plan Discussed with:   Anesthesia Plan Comments: (PAT note written 12/29/2021 by Myra Gianotti, PA-C. )       Anesthesia Quick Evaluation

## 2021-12-30 ENCOUNTER — Other Ambulatory Visit: Payer: Self-pay

## 2021-12-30 ENCOUNTER — Encounter (HOSPITAL_COMMUNITY): Payer: Self-pay | Admitting: Neurosurgery

## 2021-12-30 ENCOUNTER — Ambulatory Visit (HOSPITAL_COMMUNITY): Payer: BC Managed Care – PPO | Admitting: Vascular Surgery

## 2021-12-30 ENCOUNTER — Ambulatory Visit (HOSPITAL_COMMUNITY): Payer: BC Managed Care – PPO

## 2021-12-30 ENCOUNTER — Encounter (HOSPITAL_COMMUNITY)
Admission: RE | Disposition: A | Discharge status: Home / Self Care | Payer: Self-pay | Source: Home / Self Care | Attending: Neurosurgery

## 2021-12-30 ENCOUNTER — Ambulatory Visit (HOSPITAL_COMMUNITY)
Admission: RE | Admit: 2021-12-30 | Discharge: 2021-12-30 | Disposition: A | Discharge status: Home / Self Care | Payer: BC Managed Care – PPO | Attending: Neurosurgery | Admitting: Neurosurgery

## 2021-12-30 DIAGNOSIS — M48061 Spinal stenosis, lumbar region without neurogenic claudication: Secondary | ICD-10-CM | POA: Diagnosis not present

## 2021-12-30 DIAGNOSIS — G4733 Obstructive sleep apnea (adult) (pediatric): Secondary | ICD-10-CM | POA: Diagnosis not present

## 2021-12-30 DIAGNOSIS — M5126 Other intervertebral disc displacement, lumbar region: Secondary | ICD-10-CM | POA: Insufficient documentation

## 2021-12-30 DIAGNOSIS — F419 Anxiety disorder, unspecified: Secondary | ICD-10-CM | POA: Insufficient documentation

## 2021-12-30 DIAGNOSIS — Z6841 Body Mass Index (BMI) 40.0 and over, adult: Secondary | ICD-10-CM | POA: Diagnosis not present

## 2021-12-30 DIAGNOSIS — F17200 Nicotine dependence, unspecified, uncomplicated: Secondary | ICD-10-CM | POA: Insufficient documentation

## 2021-12-30 DIAGNOSIS — I10 Essential (primary) hypertension: Secondary | ICD-10-CM | POA: Insufficient documentation

## 2021-12-30 DIAGNOSIS — Z981 Arthrodesis status: Secondary | ICD-10-CM | POA: Diagnosis not present

## 2021-12-30 HISTORY — DX: Attention-deficit hyperactivity disorder, unspecified type: F90.9

## 2021-12-30 HISTORY — PX: LUMBAR LAMINECTOMY/DECOMPRESSION MICRODISCECTOMY: SHX5026

## 2021-12-30 LAB — BASIC METABOLIC PANEL
Anion gap: 10 (ref 5–15)
BUN: 15 mg/dL (ref 6–20)
CO2: 23 mmol/L (ref 22–32)
Calcium: 8.8 mg/dL — ABNORMAL LOW (ref 8.9–10.3)
Chloride: 103 mmol/L (ref 98–111)
Creatinine, Ser: 0.66 mg/dL (ref 0.61–1.24)
GFR, Estimated: >60 mL/min (ref >60)
Glucose, Bld: 97 mg/dL (ref 70–99)
Potassium: 3.7 mmol/L (ref 3.5–5.1)
Sodium: 136 mmol/L (ref 135–145)

## 2021-12-30 LAB — CBC
HCT: 43.3 % (ref 39.0–52.0)
Hemoglobin: 14.7 g/dL (ref 13.0–17.0)
MCH: 31.9 pg (ref 26.0–34.0)
MCHC: 33.9 g/dL (ref 30.0–36.0)
MCV: 93.9 fL (ref 80.0–100.0)
Platelets: 264 10*3/uL (ref 150–400)
RBC: 4.61 MIL/uL (ref 4.22–5.81)
RDW: 12.5 % (ref 11.5–15.5)
WBC: 10.1 10*3/uL (ref 4.0–10.5)
nRBC: 0 % (ref 0.0–0.2)

## 2021-12-30 LAB — SURGICAL PCR SCREEN
MRSA, PCR: NEGATIVE
Staphylococcus aureus: NEGATIVE

## 2021-12-30 SURGERY — LUMBAR LAMINECTOMY/DECOMPRESSION MICRODISCECTOMY 1 LEVEL
Anesthesia: General | Laterality: Right

## 2021-12-30 MED ORDER — ONDANSETRON HCL 4 MG/2ML IJ SOLN
INTRAMUSCULAR | Status: DC | PRN
  Administered 2021-12-30: 4 mg via INTRAVENOUS

## 2021-12-30 MED ORDER — PROPOFOL 10 MG/ML IV BOLUS
INTRAVENOUS | Status: DC | PRN
  Administered 2021-12-30: 10 mg via INTRAVENOUS
  Administered 2021-12-30: 300 mg via INTRAVENOUS

## 2021-12-30 MED ORDER — DEXAMETHASONE SODIUM PHOSPHATE 10 MG/ML IJ SOLN
INTRAMUSCULAR | Status: AC
  Filled 2021-12-30: qty 1

## 2021-12-30 MED ORDER — LIDOCAINE 2% (20 MG/ML) 5 ML SYRINGE
INTRAMUSCULAR | Status: DC | PRN
  Administered 2021-12-30: 60 mg via INTRAVENOUS

## 2021-12-30 MED ORDER — THROMBIN 5000 UNITS EX SOLR
CUTANEOUS | Status: DC | PRN
  Administered 2021-12-30: 5000 [IU] via TOPICAL

## 2021-12-30 MED ORDER — MIDAZOLAM HCL 2 MG/2ML IJ SOLN
INTRAMUSCULAR | Status: AC
  Filled 2021-12-30: qty 2

## 2021-12-30 MED ORDER — PROPOFOL 10 MG/ML IV BOLUS
INTRAVENOUS | Status: AC
  Filled 2021-12-30: qty 20

## 2021-12-30 MED ORDER — PROMETHAZINE HCL 25 MG/ML IJ SOLN: 6.2500 mg | INTRAMUSCULAR | Status: DC | PRN

## 2021-12-30 MED ORDER — KETOROLAC TROMETHAMINE 30 MG/ML IJ SOLN
INTRAMUSCULAR | Status: AC
  Filled 2021-12-30: qty 1

## 2021-12-30 MED ORDER — FENTANYL CITRATE (PF) 100 MCG/2ML IJ SOLN
INTRAMUSCULAR | Status: AC
  Filled 2021-12-30: qty 2

## 2021-12-30 MED ORDER — METHYLPREDNISOLONE ACETATE 80 MG/ML IJ SUSP
INTRAMUSCULAR | Status: DC | PRN
  Administered 2021-12-30: 80 mg

## 2021-12-30 MED ORDER — ACETAMINOPHEN 500 MG PO TABS
1000.0000 mg | ORAL_TABLET | Freq: Once | ORAL | Status: AC
  Administered 2021-12-30: 1000 mg via ORAL
  Filled 2021-12-30: qty 2

## 2021-12-30 MED ORDER — CHLORHEXIDINE GLUCONATE 0.12 % MT SOLN
15.0000 mL | Freq: Once | OROMUCOSAL | Status: AC
  Administered 2021-12-30: 15 mL via OROMUCOSAL
  Filled 2021-12-30: qty 15

## 2021-12-30 MED ORDER — 0.9 % SODIUM CHLORIDE (POUR BTL) OPTIME
TOPICAL | Status: DC | PRN
  Administered 2021-12-30: 1000 mL

## 2021-12-30 MED ORDER — MIDAZOLAM HCL 2 MG/2ML IJ SOLN
INTRAMUSCULAR | Status: DC | PRN
  Administered 2021-12-30: 2 mg via INTRAVENOUS

## 2021-12-30 MED ORDER — EPHEDRINE SULFATE-NACL 50-0.9 MG/10ML-% IV SOSY
PREFILLED_SYRINGE | INTRAVENOUS | Status: DC | PRN
  Administered 2021-12-30 (×2): 2.5 mg via INTRAVENOUS

## 2021-12-30 MED ORDER — HYDROMORPHONE HCL 1 MG/ML IJ SOLN
INTRAMUSCULAR | Status: DC | PRN
  Administered 2021-12-30: .5 mg via INTRAVENOUS

## 2021-12-30 MED ORDER — FENTANYL CITRATE (PF) 250 MCG/5ML IJ SOLN
INTRAMUSCULAR | Status: DC | PRN
  Administered 2021-12-30: 100 ug via INTRAVENOUS
  Administered 2021-12-30 (×3): 50 ug via INTRAVENOUS

## 2021-12-30 MED ORDER — FENTANYL CITRATE (PF) 100 MCG/2ML IJ SOLN
INTRAMUSCULAR | Status: DC | PRN
  Administered 2021-12-30: 100 ug via INTRAVENOUS

## 2021-12-30 MED ORDER — LACTATED RINGERS IV SOLN: INTRAVENOUS | Status: DC

## 2021-12-30 MED ORDER — FENTANYL CITRATE (PF) 250 MCG/5ML IJ SOLN
INTRAMUSCULAR | Status: AC
  Filled 2021-12-30: qty 5

## 2021-12-30 MED ORDER — ROCURONIUM BROMIDE 10 MG/ML (PF) SYRINGE
PREFILLED_SYRINGE | INTRAVENOUS | Status: AC
  Filled 2021-12-30: qty 10

## 2021-12-30 MED ORDER — LIDOCAINE-EPINEPHRINE 0.5 %-1:200000 IJ SOLN
INTRAMUSCULAR | Status: DC | PRN
  Administered 2021-12-30: 5 mL via INTRADERMAL

## 2021-12-30 MED ORDER — ROCURONIUM BROMIDE 10 MG/ML (PF) SYRINGE
PREFILLED_SYRINGE | INTRAVENOUS | Status: DC | PRN
  Administered 2021-12-30: 20 mg via INTRAVENOUS
  Administered 2021-12-30: 10 mg via INTRAVENOUS
  Administered 2021-12-30: 20 mg via INTRAVENOUS
  Administered 2021-12-30: 60 mg via INTRAVENOUS
  Administered 2021-12-30 (×2): 20 mg via INTRAVENOUS

## 2021-12-30 MED ORDER — EPHEDRINE 5 MG/ML INJ
INTRAVENOUS | Status: AC
  Filled 2021-12-30: qty 5

## 2021-12-30 MED ORDER — METHYLPREDNISOLONE ACETATE 80 MG/ML IJ SUSP
INTRAMUSCULAR | Status: AC
  Filled 2021-12-30: qty 1

## 2021-12-30 MED ORDER — ORAL CARE MOUTH RINSE: 15.0000 mL | Freq: Once | OROMUCOSAL | Status: AC

## 2021-12-30 MED ORDER — HYDROMORPHONE HCL 1 MG/ML IJ SOLN
INTRAMUSCULAR | Status: AC
  Filled 2021-12-30: qty 0.5

## 2021-12-30 MED ORDER — FENTANYL CITRATE (PF) 100 MCG/2ML IJ SOLN
25.0000 ug | INTRAMUSCULAR | Status: DC | PRN
  Administered 2021-12-30: 50 ug via INTRAVENOUS

## 2021-12-30 MED ORDER — LIDOCAINE 2% (20 MG/ML) 5 ML SYRINGE
INTRAMUSCULAR | Status: AC
  Filled 2021-12-30: qty 5

## 2021-12-30 MED ORDER — THROMBIN 5000 UNITS EX SOLR
CUTANEOUS | Status: AC
  Filled 2021-12-30: qty 5000

## 2021-12-30 MED ORDER — CEFAZOLIN IN SODIUM CHLORIDE 3-0.9 GM/100ML-% IV SOLN
3.0000 g | INTRAVENOUS | Status: AC
  Administered 2021-12-30: 3 g via INTRAVENOUS
  Filled 2021-12-30: qty 100

## 2021-12-30 MED ORDER — DEXAMETHASONE SODIUM PHOSPHATE 10 MG/ML IJ SOLN
INTRAMUSCULAR | Status: DC | PRN
  Administered 2021-12-30: 10 mg via INTRAVENOUS

## 2021-12-30 MED ORDER — CHLORHEXIDINE GLUCONATE CLOTH 2 % EX PADS: 6.0000 | MEDICATED_PAD | Freq: Once | CUTANEOUS | Status: DC

## 2021-12-30 MED ORDER — SUGAMMADEX SODIUM 200 MG/2ML IV SOLN
INTRAVENOUS | Status: DC | PRN
  Administered 2021-12-30: 400 mg via INTRAVENOUS

## 2021-12-30 MED ORDER — LIDOCAINE-EPINEPHRINE 0.5 %-1:200000 IJ SOLN
INTRAMUSCULAR | Status: AC
  Filled 2021-12-30: qty 1

## 2021-12-30 MED ORDER — ONDANSETRON HCL 4 MG/2ML IJ SOLN
INTRAMUSCULAR | Status: AC
  Filled 2021-12-30: qty 2

## 2021-12-30 SURGICAL SUPPLY — 47 items
ADH SKN CLS APL DERMABOND .7 (GAUZE/BANDAGES/DRESSINGS) ×1
APL SKNCLS STERI-STRIP NONHPOA (GAUZE/BANDAGES/DRESSINGS)
BAG COUNTER SPONGE SURGICOUNT (BAG) ×2 IMPLANT
BAG SPNG CNTER NS LX DISP (BAG) ×2
BAND INSRT 18 STRL LF DISP RB (MISCELLANEOUS) ×2
BAND RUBBER #18 3X1/16 STRL (MISCELLANEOUS) ×4 IMPLANT
BENZOIN TINCTURE PRP APPL 2/3 (GAUZE/BANDAGES/DRESSINGS) IMPLANT
BLADE CLIPPER SURG (BLADE) IMPLANT
BUR MATCHSTICK NEURO 3.0 LAGG (BURR) ×2 IMPLANT
BUR PRECISION FLUTE 5.0 (BURR) IMPLANT
CANISTER SUCT 3000ML PPV (MISCELLANEOUS) ×2 IMPLANT
DERMABOND ADVANCED .7 DNX12 (GAUZE/BANDAGES/DRESSINGS) ×2 IMPLANT
DRAPE LAPAROTOMY 100X72X124 (DRAPES) ×2 IMPLANT
DRAPE MICROSCOPE SLANT 54X150 (MISCELLANEOUS) ×2 IMPLANT
DRAPE SURG 17X23 STRL (DRAPES) ×2 IMPLANT
DURAPREP 26ML APPLICATOR (WOUND CARE) ×2 IMPLANT
ELECT REM PT RETURN 9FT ADLT (ELECTROSURGICAL) ×1
ELECTRODE REM PT RTRN 9FT ADLT (ELECTROSURGICAL) ×2 IMPLANT
GAUZE 4X4 16PLY ~~LOC~~+RFID DBL (SPONGE) IMPLANT
GAUZE SPONGE 4X4 12PLY STRL (GAUZE/BANDAGES/DRESSINGS) IMPLANT
GLOVE ECLIPSE 6.5 STRL STRAW (GLOVE) ×2 IMPLANT
GLOVE EXAM NITRILE XL STR (GLOVE) IMPLANT
GOWN STRL REUS W/ TWL LRG LVL3 (GOWN DISPOSABLE) ×4 IMPLANT
GOWN STRL REUS W/ TWL XL LVL3 (GOWN DISPOSABLE) IMPLANT
GOWN STRL REUS W/TWL 2XL LVL3 (GOWN DISPOSABLE) IMPLANT
GOWN STRL REUS W/TWL LRG LVL3 (GOWN DISPOSABLE) ×3
GOWN STRL REUS W/TWL XL LVL3 (GOWN DISPOSABLE) ×1
KIT BASIN OR (CUSTOM PROCEDURE TRAY) ×2 IMPLANT
KIT TURNOVER KIT B (KITS) ×2 IMPLANT
NDL HYPO 25X1 1.5 SAFETY (NEEDLE) ×2 IMPLANT
NDL SPNL 18GX3.5 QUINCKE PK (NEEDLE) IMPLANT
NEEDLE HYPO 25X1 1.5 SAFETY (NEEDLE) ×1 IMPLANT
NEEDLE SPNL 18GX3.5 QUINCKE PK (NEEDLE) IMPLANT
NS IRRIG 1000ML POUR BTL (IV SOLUTION) ×2 IMPLANT
PACK LAMINECTOMY NEURO (CUSTOM PROCEDURE TRAY) ×2 IMPLANT
PAD ARMBOARD 7.5X6 YLW CONV (MISCELLANEOUS) ×6 IMPLANT
SPIKE FLUID TRANSFER (MISCELLANEOUS) ×2 IMPLANT
SPONGE SURGIFOAM ABS GEL SZ50 (HEMOSTASIS) ×2 IMPLANT
SPONGE T-LAP 4X18 ~~LOC~~+RFID (SPONGE) IMPLANT
STRIP CLOSURE SKIN 1/2X4 (GAUZE/BANDAGES/DRESSINGS) IMPLANT
SUT VIC AB 0 CT1 18XCR BRD8 (SUTURE) ×2 IMPLANT
SUT VIC AB 0 CT1 8-18 (SUTURE) ×1
SUT VIC AB 2-0 CT1 18 (SUTURE) ×2 IMPLANT
SUT VIC AB 3-0 SH 8-18 (SUTURE) ×2 IMPLANT
TOWEL GREEN STERILE (TOWEL DISPOSABLE) ×2 IMPLANT
TOWEL GREEN STERILE FF (TOWEL DISPOSABLE) ×2 IMPLANT
WATER STERILE IRR 1000ML POUR (IV SOLUTION) ×2 IMPLANT

## 2021-12-30 NOTE — H&P (Signed)
BP (!) 158/98   Pulse (!) 58   Temp 98.4 F (36.9 C) (Oral)   Resp 18   Ht 5\' 11"  (1.803 m)   Wt (!) 168.7 kg   SpO2 94%   BMI 51.88 kg/m  Mr. Juan Franklin is well known to me, having had a last L5-S1 surgery on the right side in 2021.  He returns today.  After unplugging a stoves, something happened and his right leg is on fire yet again.  He weighs 385 pounds.  Temperature is 98.5, blood pressure 148/82, pulse 87, pain is 7/10.  He has not been to work for approximately 2 weeks as the pain is simply too much.  I need to get an MRI with and without contrast, but will send him to physical therapy.  But, I did expressly tell him if physical therapy is not helping after 2 or 3 visits, then it is not going to help him.  We do not have to go for 6 weeks and just waste his time and money when it is not doing anything for him.     PHYSICAL EXAMINATION :  He is alert, oriented by 4.  Mildly antalgic gait.  No reflex elicited at the right knee or right ankle; 2+ at the left knee, left ankle; present in the upper extremities.     He has been on anti-inflammatories.  They have not helped.  He has had 2 steroid tapers in the last month, also without any beneficial effect.     He does come with an MRI that was performed on September 18th.  He has a very large herniated disc at 3-4 which has migrated caudally behind the body of L4.  The L4-5 space looks okay, though he has some chronic degeneration there.  Same thing at 5-1.  They point to a left-sided problem at 5-1, but this was done without contrast and, most importantly, he has absolutely no pain on the left side.  So, I absolutely believe that the 3-4 disc is the one causing the problem right now and we do not need to do anything else at any other level.  We will proceed with an L3-4 right lumbar discectomy next week, October 4.

## 2021-12-30 NOTE — Transfer of Care (Signed)
Immediate Anesthesia Transfer of Care Note  Patient: Juan Franklin  Procedure(s) Performed: Right Lumbar Three- Four  Microdiscectomy (Right)  Patient Location: PACU  Anesthesia Type:General  Level of Consciousness: drowsy and patient cooperative  Airway & Oxygen Therapy: Patient Spontanous Breathing and Patient connected to nasal cannula oxygen  Post-op Assessment: Report given to RN, Post -op Vital signs reviewed and stable and Patient moving all extremities  Post vital signs: Reviewed and stable  Last Vitals:  Vitals Value Taken Time  BP 155/87 12/30/21 1804  Temp    Pulse 87 12/30/21 1806  Resp 15 12/30/21 1806  SpO2 91 % 12/30/21 1806  Vitals shown include unvalidated device data.  Last Pain:  Vitals:   12/30/21 1240  TempSrc:   PainSc: 4       Patients Stated Pain Goal: 0 (26/33/35 4562)  Complications: No notable events documented.

## 2021-12-30 NOTE — Anesthesia Procedure Notes (Signed)
Procedure Name: Intubation Date/Time: 12/30/2021 2:44 PM  Performed by: Dorann Lodge, CRNAPre-anesthesia Checklist: Patient identified, Emergency Drugs available, Suction available and Patient being monitored Patient Re-evaluated:Patient Re-evaluated prior to induction Oxygen Delivery Method: Circle System Utilized Preoxygenation: Pre-oxygenation with 100% oxygen Induction Type: IV induction Ventilation: Oral airway inserted - appropriate to patient size and Two handed mask ventilation required Laryngoscope Size: Mac and 4 Grade View: Grade I Tube type: Oral Tube size: 7.5 mm Number of attempts: 1 Airway Equipment and Method: Stylet and Oral airway Placement Confirmation: ETT inserted through vocal cords under direct vision, positive ETCO2 and breath sounds checked- equal and bilateral Secured at: 23 cm Tube secured with: Tape Dental Injury: Teeth and Oropharynx as per pre-operative assessment

## 2021-12-30 NOTE — Op Note (Signed)
12/30/2021  6:40 PM  PATIENT:  Juan Franklin  46 y.o. male With pain in the right lower extremity due to a herniated disc at L3/4 PRE-OPERATIVE DIAGNOSIS:  L3/4 intervertebral disc displacement, Right POST-OPERATIVE DIAGNOSIS:  same  PROCEDURE:  Procedure(s): Right Lumbar Three- Four  Microdiscectomy  SURGEON:   Surgeon(s): Ashok Pall, MD  ASSISTANTS:Nundkumar, Nena Polio  ANESTHESIA:   general  EBL:  Total I/O In: 1350 [I.V.:1250; IV Piggyback:100] Out: 75 [Blood:75]  BLOOD ADMINISTERED:none  CELL SAVER GIVEN:none  COUNT:per nursing  DRAINS: none   SPECIMEN:  No Specimen  DICTATION: [name] was taken to the operating room, intubated and placed under a general anesthetic without difficulty. He was positioned prone on a Jackson Table with all pressure points padded. His back was prepped and draped in a sterile manner. I opened the skin with a 10 blade and carried the dissection down to the thoracolumbar fascia. I used both sharp dissection and the monopolar cautery to expose the lamina of L3, and L4. I confirmed my location with an intraoperative xray.  I used the drill, Kerrison punches, and curettes to perform a semihemilaminectomy of L3. I used the punches to remove the ligamentum flavum to expose the thecal sac. I brought the microscope into the operative field and with Dr.Nundkumar's assistance we started our decompression of the spinal canal, thecal sac and L4 root(s). I cauterized epidural veins overlying the disc space then divided them sharply. I opened the disc space with a 15 blade and proceeded with the discectomy. I used pituitary rongeurs, curettes, and other instruments to remove disc material. After the discectomy was completed I inspected the L4 nerve root and felt it was well decompressed. I explored rostrally, laterally, medially, and caudally and was satisfied with the decompression. I irrigated the wound, then closed in layers. I approximated the thoracolumbar  fascia, subcutaneous, and subcuticular planes with vicryl sutures. I used dermabond for a sterile dressing.   PLAN OF CARE: Discharge to home after PACU  PATIENT DISPOSITION:  PACU - hemodynamically stable.   Delay start of Pharmacological VTE agent (>24hrs) due to surgical blood loss or risk of bleeding:  no

## 2021-12-30 NOTE — Discharge Instructions (Addendum)
Lumbar Discectomy °Care After °A discectomy involves removal of discmaterial (the cartilage-like structures located between the bones of the back). It is done to relieve pressure on nerve roots. It can be used as a treatment for a back problem. The time in surgery depends on the findings in surgery and what is necessary to correct the problems. °HOME CARE INSTRUCTIONS  °· Check the cut (incision) made by the surgeon twice a day for signs of infection. Some signs of infection may include:  °· A foul smelling, greenish or yellowish discharge from the wound.  °· Increased pain.  °· Increased redness over the incision (operative) site.  °· The skin edges may separate.  °· Flu-like symptoms (problems).  °· A temperature above 101.5° F (38.6° C).  °· Change your bandages in about 24 to 36 hours following surgery or as directed.  °· You may shower tomrrow.  Avoid bathtubs, swimming pools and hot tubs for three weeks or until your incision has healed completely. °· Follow your doctor's instructions as to safe activities, exercises, and physical therapy.  °· Weight reduction may be beneficial if you are overweight.  °· Daily exercise is helpful to prevent the return of problems. Walking is permitted. You may use a treadmill without an incline. Cut down on activities and exercise if you have discomfort. You may also go up and down stairs as much as you can tolerate.  °· DO NOT lift anything heavier than 10 to 15 lbs. Avoid bending or twisting at the waist. Always bend your knees when lifting.  °· Maintain strength and range of motion as instructed.  °· Do not drive for 10 days, or as directed by your doctors. You may be a passenger . Lying back in the passenger seat may be more comfortable for you. Always wear a seatbelt.  °· Limit your sitting in a regular chair to 20 to 30 minutes at a time. There are no limitations for sitting in a recliner. You should lie down or walk in between sitting periods.  °· Only take  over-the-counter or prescription medicines for pain, discomfort, or fever as directed by your caregiver.  °SEEK MEDICAL CARE IF:  °· There is increased bleeding (more than a small spot) from the wound.  °· You notice redness, swelling, or increasing pain in the wound.  °· Pus is coming from wound.  °· You develop an unexplained oral temperature above 102° F (38.9° C) develops.  °· You notice a foul smell coming from the wound or dressing.  °· You have increasing pain in your wound.  °SEEK IMMEDIATE MEDICAL CARE IF:  °· You develop a rash.  °· You have difficulty breathing.  °· You develop any allergic problems to medicines given.  °Document Released: 02/17/2004 Document Revised: 03/03/2011 Document Reviewed: 06/07/2007 °ExitCare® Patient Information °

## 2021-12-31 ENCOUNTER — Encounter (HOSPITAL_COMMUNITY): Payer: Self-pay | Admitting: Neurosurgery

## 2021-12-31 NOTE — Anesthesia Postprocedure Evaluation (Signed)
Anesthesia Post Note  Patient: Juan Franklin  Procedure(s) Performed: Right Lumbar Three- Four  Microdiscectomy (Right)     Patient location during evaluation: PACU Anesthesia Type: General Level of consciousness: awake and alert Pain management: pain level controlled Vital Signs Assessment: post-procedure vital signs reviewed and stable Respiratory status: spontaneous breathing, nonlabored ventilation and respiratory function stable Cardiovascular status: blood pressure returned to baseline and stable Postop Assessment: no apparent nausea or vomiting Anesthetic complications: no   No notable events documented.  Last Vitals:  Vitals:   12/30/21 1900 12/30/21 1915  BP: 137/81 131/78  Pulse: 77 74  Resp: 19 19  Temp:    SpO2: 96% 94%    Last Pain:  Vitals:   12/30/21 1900  TempSrc:   PainSc: 3                  Wandalene Abrams,W. EDMOND

## 2022-01-03 ENCOUNTER — Other Ambulatory Visit: Payer: Self-pay

## 2022-01-25 DIAGNOSIS — M4722 Other spondylosis with radiculopathy, cervical region: Secondary | ICD-10-CM | POA: Diagnosis not present

## 2022-01-26 ENCOUNTER — Other Ambulatory Visit: Payer: Self-pay | Admitting: Internal Medicine

## 2022-02-03 ENCOUNTER — Other Ambulatory Visit: Payer: Self-pay | Admitting: Internal Medicine

## 2022-02-04 NOTE — Telephone Encounter (Signed)
Last filled 12-24-21 #180 Last OV 04-13-21 Next OV 04-15-22 [facility]

## 2022-03-16 ENCOUNTER — Other Ambulatory Visit: Payer: Self-pay | Admitting: Neurosurgery

## 2022-03-16 DIAGNOSIS — M4722 Other spondylosis with radiculopathy, cervical region: Secondary | ICD-10-CM

## 2022-03-22 ENCOUNTER — Other Ambulatory Visit: Payer: Self-pay | Admitting: Internal Medicine

## 2022-03-22 DIAGNOSIS — J309 Allergic rhinitis, unspecified: Secondary | ICD-10-CM

## 2022-03-24 ENCOUNTER — Telehealth: Payer: Self-pay

## 2022-03-24 NOTE — Patient Outreach (Signed)
  Care Coordination   Initial Visit Note   03/24/2022 Name: Juan Franklin MRN: [id number] DOB: 09-08-1975  Juan Franklin is a 46 y.o. year old male who sees Karie Schwalbe, MD for primary care. I spoke with  Lonna Cobb by phone today.  What matters to the patients health and wellness today?  Patient states he does not have any nursing or community resource needs at this time.     Goals Addressed             This Visit's Progress    Care coordination activities- no follow up needed.       Care coordination program/ services discussed  Social determinants of health survey completed Patient advised to contact primary care provider office  if care coordination services needed in the future         SDOH assessments and interventions completed:  Yes  SDOH Interventions Today    Flowsheet Row Most Recent Value  SDOH Interventions   Food Insecurity Interventions Intervention Not Indicated  Housing Interventions Intervention Not Indicated  Transportation Interventions Intervention Not Indicated        Care Coordination Interventions:  Yes, provided   Follow up plan: No further intervention required.   Encounter Outcome:  Pt. Visit Completed   George Ina RN,BSN,CCM [facility] Care Coordination [phone] direct line

## 2022-04-07 ENCOUNTER — Ambulatory Visit
Admission: RE | Admit: 2022-04-07 | Discharge: 2022-04-07 | Disposition: A | Discharge status: Home / Self Care | Payer: BC Managed Care – PPO | Source: Ambulatory Visit | Attending: Neurosurgery | Admitting: Neurosurgery

## 2022-04-07 DIAGNOSIS — M4802 Spinal stenosis, cervical region: Secondary | ICD-10-CM | POA: Diagnosis not present

## 2022-04-07 DIAGNOSIS — M4722 Other spondylosis with radiculopathy, cervical region: Secondary | ICD-10-CM

## 2022-04-07 DIAGNOSIS — M47812 Spondylosis without myelopathy or radiculopathy, cervical region: Secondary | ICD-10-CM | POA: Diagnosis not present

## 2022-04-14 DIAGNOSIS — M4722 Other spondylosis with radiculopathy, cervical region: Secondary | ICD-10-CM | POA: Diagnosis not present

## 2022-04-14 DIAGNOSIS — Z6841 Body Mass Index (BMI) 40.0 and over, adult: Secondary | ICD-10-CM | POA: Diagnosis not present

## 2022-04-15 ENCOUNTER — Encounter: Payer: Self-pay | Admitting: Internal Medicine

## 2022-04-15 ENCOUNTER — Ambulatory Visit (INDEPENDENT_AMBULATORY_CARE_PROVIDER_SITE_OTHER): Payer: BC Managed Care – PPO | Admitting: Internal Medicine

## 2022-04-15 VITALS — BP 132/88 | HR 74 | Temp 98.3°F | Ht 73.0 in | Wt 391.0 lb

## 2022-04-15 DIAGNOSIS — M545 Low back pain, unspecified: Secondary | ICD-10-CM

## 2022-04-15 DIAGNOSIS — G4733 Obstructive sleep apnea (adult) (pediatric): Secondary | ICD-10-CM

## 2022-04-15 DIAGNOSIS — Z0000 Encounter for general adult medical examination without abnormal findings: Secondary | ICD-10-CM

## 2022-04-15 DIAGNOSIS — Z23 Encounter for immunization: Secondary | ICD-10-CM | POA: Diagnosis not present

## 2022-04-15 DIAGNOSIS — M6289 Other specified disorders of muscle: Secondary | ICD-10-CM | POA: Diagnosis not present

## 2022-04-15 DIAGNOSIS — I10 Essential (primary) hypertension: Secondary | ICD-10-CM

## 2022-04-15 MED ORDER — TIZANIDINE HCL 2 MG PO TABS: 2.0000 mg | ORAL_TABLET | Freq: Two times a day (BID) | ORAL | 0 refills | Status: DC | PRN

## 2022-04-15 NOTE — Assessment & Plan Note (Signed)
Must work on fitness---walk more, cut beer, etc Discussed the smoking--asked him to at least cut back Prefers to wait till 36 for colon cancer screening Prefers no flu or COVID vaccines Tdap today

## 2022-04-15 NOTE — Addendum Note (Signed)
Addended by: Pilar Grammes on: 04/15/2022 10:04 AM   Modules accepted: Orders

## 2022-04-15 NOTE — Assessment & Plan Note (Signed)
Uses the CPAP nightly

## 2022-04-15 NOTE — Assessment & Plan Note (Signed)
Weight up even more Discussed calorie restriction and more walking

## 2022-04-15 NOTE — Progress Notes (Signed)
Subjective:    Patient ID: Juan Franklin, male    DOB: May 09, 1975, 47 y.o.   MRN: [id number]  HPI Here for physical  Had lumbar surgery in October Taking time to get better---overdoes it at times Only missed one day of work---able to work remotely  [employer] up with girlfriend Going to bar 3 nights a week Weight is up--relates to the remote work and back surgery Still smoking--down to 1 PPD (a lot less)  Current Outpatient Medications on File Prior to Visit  Medication Sig Dispense Refill   chlorthalidone (HYGROTON) 25 MG tablet TAKE 1 TABLET BY MOUTH ONCE DAILY 90 tablet 3   clonazePAM (KLONOPIN) 0.5 MG tablet TAKE 1 TABLET BY MOUTH TWICE A DAY AS NEEDED 180 tablet 0   gabapentin (NEURONTIN) 300 MG capsule Take 2 capsules (600 mg total) by mouth at bedtime. (Patient taking differently: Take 300-600 mg by mouth See admin instructions. 300 mg in the morning, 600 mg at bedtime) 180 capsule 3   losartan (COZAAR) 100 MG tablet TAKE 1 TABLET BY MOUTH ONCE DAILY EACH EVENING. 90 tablet 3   metoprolol succinate (TOPROL-XL) 100 MG 24 hr tablet Take 1 tablet (100 mg total) by mouth at bedtime. 90 tablet 0   Misc Natural Products (ELDERBERRY IMMUNE COMPLEX PO) Take 1 capsule by mouth in the morning and at bedtime.     montelukast (SINGULAIR) 10 MG tablet TAKE 1 TABLET BY MOUTH EVERY NIGHT AT BEDTIME 100 tablet 0   nystatin cream (MYCOSTATIN) Apply 1 Application topically 2 (two) times daily as needed for dry skin. 30 g 6   OVER THE COUNTER MEDICATION Take 2 capsules by mouth daily. Nitrous Oxide     OVER THE COUNTER MEDICATION Take 2 capsules by mouth daily. Nicotinamide mononucleotide (NMN)     oxyCODONE (OXY IR/ROXICODONE) 5 MG immediate release tablet Take 5 mg by mouth every 6 (six) hours as needed for severe pain.     tiZANidine (ZANAFLEX) 2 MG tablet Take 1 tablet (2 mg total) by mouth 2 (two) times daily as needed for muscle spasms. (Patient not taking: Reported on 04/15/2022) 30 tablet 0    No current facility-administered medications on file prior to visit.    Allergies  Allergen Reactions   Hydrocodone Bit-Homatrop Mbr Itching    Has tolerated vicodin in the past   Lisinopril Cough    Past Medical History:  Diagnosis Date   ADHD (attention deficit hyperactivity disorder)    Allergic rhinitis    Anxiety    DDD (degenerative disc disease), lumbar 12/07/2021   in [place]   GERD (gastroesophageal reflux disease)    HNP (herniated nucleus pulposus), lumbar 05/06/2019   in [place]   Hypertension    Obstructive sleep apnea    uses cpap    Past Surgical History:  Procedure Laterality Date   LUMBAR DISC SURGERY  07/26/2008   Dr. Franky Macho   LUMBAR DISC SURGERY  03/28/2010   L4-5   LUMBAR LAMINECTOMY/DECOMPRESSION MICRODISCECTOMY Left 06/27/2019   Procedure: Left Lumbar Five Sacral One Microdiscectomy;  Surgeon: Coletta Memos, MD;  Location: [facility] OR;  Service: Neurosurgery;  Laterality: Left;  Posterior   LUMBAR LAMINECTOMY/DECOMPRESSION MICRODISCECTOMY Right 12/30/2021   Procedure: Right Lumbar Three- Four  Microdiscectomy;  Surgeon: Coletta Memos, MD;  Location: [facility] OR;  Service: Neurosurgery;  Laterality: Right;  RM 18/3C// To Follow in rm 18   NASAL SINUS SURGERY  12/26/2008   Dr. 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  Now drinks 6-8 at night when he doesn't have his daughter.   Drug use: Never   Sexual activity: Yes  Other Topics Concern   Not on file  Social History Narrative       Social Determinants of Health   Financial Resource Strain: Not on file  Food Insecurity: No Food Insecurity (03/24/2022)   Hunger Vital Sign    Worried About Running Out of Food in the Last Year: Never true    Ran Out of Food in the Last Year: Never true  Transportation Needs: No Transportation Needs (03/24/2022)   PRAPARE - Hydrologist (Medical): No    Lack of Transportation (Non-Medical): No  Physical Activity: Not on file  Stress: Not on file  Social Connections: Not on file  Intimate Partner Violence: Not on file   Review of Systems  Constitutional:  Positive for unexpected weight change.       Some lack of energy at times Wears seat belt  HENT:  Positive for tinnitus. Negative for dental problem.        Some left ear hearing loss Keeps up with dentist  Eyes:  Negative for visual disturbance.       No serious issues--more far sightedness  Respiratory:  Negative for chest tightness and shortness of breath.        Some smoker's cough  Cardiovascular:  Negative for chest pain.       Occ skipped beats Some leg swelling--better with elevation  Gastrointestinal:  Negative for blood in stool and constipation.       No heartburn  Endocrine: Negative for polydipsia and polyuria.  Genitourinary:  Negative for difficulty urinating and urgency.       Discussed safe sex--no ED  Musculoskeletal:  Positive for back pain and neck pain. Negative for joint swelling.  Skin:  Negative for rash.  Allergic/Immunologic: Negative for environmental allergies and immunocompromised  state.  Neurological:  Negative for dizziness, syncope, light-headedness and headaches.  Hematological:  Negative for adenopathy. Does not bruise/bleed easily.  Psychiatric/Behavioral:  Negative for dysphoric mood.        Sleeps with CPAP--some issues with neck/back pain Chronic mild anxiety       Objective:   Physical Exam Constitutional:      Appearance: Normal appearance. He is obese.  HENT:  Mouth/Throat:     Pharynx: No oropharyngeal exudate or posterior oropharyngeal erythema.  Eyes:     Conjunctiva/sclera: Conjunctivae normal.     Pupils: Pupils are equal, round, and reactive to light.  Cardiovascular:     Rate and Rhythm: Normal rate and regular rhythm.     Pulses: Normal pulses.     Heart sounds:     No gallop.  Pulmonary:     Effort: Pulmonary effort is normal.     Breath sounds: No wheezing or rales.     Comments: Slight expiratory squeaks Abdominal:     Palpations: Abdomen is soft.     Tenderness: There is no abdominal tenderness.  Musculoskeletal:     Cervical back: Neck supple.     Right lower leg: No edema.     Left lower leg: No edema.  Lymphadenopathy:     Cervical: No cervical adenopathy.  Skin:    Findings: No lesion or rash.  Neurological:     General: No focal deficit present.     Mental Status: He is alert and oriented to person, place, and time.  Psychiatric:        Mood and Affect: Mood normal.        Behavior: Behavior normal.            Assessment & Plan:

## 2022-04-15 NOTE — Assessment & Plan Note (Signed)
BP Readings from Last 3 Encounters:  04/15/22 132/88  12/30/21 131/78  11/25/21 130/88   Controlled on losartan 100 and metoprolol 100 daily

## 2022-04-22 ENCOUNTER — Other Ambulatory Visit: Payer: Self-pay | Admitting: Internal Medicine

## 2022-04-22 ENCOUNTER — Telehealth: Payer: Self-pay | Admitting: Internal Medicine

## 2022-04-22 MED ORDER — NYSTATIN-TRIAMCINOLONE 100000-0.1 UNIT/GM-% EX OINT: 1.0000 | TOPICAL_OINTMENT | Freq: Two times a day (BID) | CUTANEOUS | 1 refills | Status: AC

## 2022-04-22 NOTE — Telephone Encounter (Signed)
Patient called in and stated that he was prescribed nystatin cream (MYCOSTATIN) but it doesn't stay on well. He stated that the pharmacy stated he needs nystatin ointment. Thank you!

## 2022-04-22 NOTE — Telephone Encounter (Signed)
Sent mycolog--with the steroid Might work better overall anyway

## 2022-04-22 NOTE — Addendum Note (Signed)
Addended by: Viviana Simpler I on: 04/22/2022 02:26 PM   Modules accepted: Orders

## 2022-04-25 NOTE — Telephone Encounter (Signed)
Left message on voicemail for patient to call the office back. 

## 2022-04-26 NOTE — Telephone Encounter (Signed)
Patient returned call,I let him know that DR letvak sent in a new cream for him,he okayed message.

## 2022-04-28 ENCOUNTER — Encounter: Payer: Self-pay | Admitting: Internal Medicine

## 2022-04-28 ENCOUNTER — Ambulatory Visit (INDEPENDENT_AMBULATORY_CARE_PROVIDER_SITE_OTHER): Payer: BC Managed Care – PPO | Admitting: Internal Medicine

## 2022-04-28 VITALS — BP 130/86 | HR 70 | Temp 97.0°F | Ht 73.0 in | Wt 381.0 lb

## 2022-04-28 DIAGNOSIS — L03116 Cellulitis of left lower limb: Secondary | ICD-10-CM

## 2022-04-28 MED ORDER — AMOXICILLIN-POT CLAVULANATE 875-125 MG PO TABS: 1.0000 | ORAL_TABLET | Freq: Two times a day (BID) | ORAL | 1 refills | Status: DC
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
## 2022-04-28 NOTE — Assessment & Plan Note (Signed)
Clear infection in lower calf Slight swelling but nothing else to suggest DVT (in setting of obvious infection) Will treat with augmentin 875 bid (#14 x 1) If any worsened swelling, would consider checked ultrasound

## 2022-05-09 ENCOUNTER — Other Ambulatory Visit: Payer: Self-pay | Admitting: Internal Medicine

## 2022-05-09 DIAGNOSIS — M545 Low back pain, unspecified: Secondary | ICD-10-CM

## 2022-05-09 DIAGNOSIS — M6289 Other specified disorders of muscle: Secondary | ICD-10-CM

## 2022-05-09 NOTE — Telephone Encounter (Signed)
Clonazepam Last filled 02-04-22 #180 Last OV 04-28-22 Next OV 04-19-23 [facility]

## 2022-05-10 DIAGNOSIS — M542 Cervicalgia: Secondary | ICD-10-CM | POA: Diagnosis not present

## 2022-05-12 DIAGNOSIS — M542 Cervicalgia: Secondary | ICD-10-CM | POA: Diagnosis not present

## 2022-05-17 DIAGNOSIS — M542 Cervicalgia: Secondary | ICD-10-CM | POA: Diagnosis not present

## 2022-05-23 DIAGNOSIS — M542 Cervicalgia: Secondary | ICD-10-CM | POA: Diagnosis not present

## 2022-05-26 DIAGNOSIS — M4722 Other spondylosis with radiculopathy, cervical region: Secondary | ICD-10-CM | POA: Diagnosis not present

## 2022-05-30 DIAGNOSIS — M542 Cervicalgia: Secondary | ICD-10-CM | POA: Diagnosis not present

## 2022-06-02 DIAGNOSIS — M542 Cervicalgia: Secondary | ICD-10-CM | POA: Diagnosis not present

## 2022-06-06 DIAGNOSIS — M542 Cervicalgia: Secondary | ICD-10-CM | POA: Diagnosis not present

## 2022-06-10 DIAGNOSIS — M542 Cervicalgia: Secondary | ICD-10-CM | POA: Diagnosis not present

## 2022-06-13 DIAGNOSIS — M542 Cervicalgia: Secondary | ICD-10-CM | POA: Diagnosis not present

## 2022-06-16 DIAGNOSIS — M542 Cervicalgia: Secondary | ICD-10-CM | POA: Diagnosis not present

## 2022-06-20 DIAGNOSIS — M542 Cervicalgia: Secondary | ICD-10-CM | POA: Diagnosis not present

## 2022-06-22 DIAGNOSIS — M542 Cervicalgia: Secondary | ICD-10-CM | POA: Diagnosis not present

## 2022-06-27 DIAGNOSIS — M542 Cervicalgia: Secondary | ICD-10-CM | POA: Diagnosis not present

## 2022-06-30 DIAGNOSIS — M542 Cervicalgia: Secondary | ICD-10-CM | POA: Diagnosis not present

## 2022-07-04 DIAGNOSIS — M542 Cervicalgia: Secondary | ICD-10-CM | POA: Diagnosis not present

## 2022-08-05 ENCOUNTER — Other Ambulatory Visit: Payer: Self-pay | Admitting: Internal Medicine

## 2022-08-11 DIAGNOSIS — M5126 Other intervertebral disc displacement, lumbar region: Secondary | ICD-10-CM | POA: Diagnosis not present

## 2022-08-11 DIAGNOSIS — Z6841 Body Mass Index (BMI) 40.0 and over, adult: Secondary | ICD-10-CM | POA: Diagnosis not present

## 2022-09-06 ENCOUNTER — Other Ambulatory Visit: Payer: Self-pay | Admitting: Internal Medicine

## 2022-09-06 NOTE — Telephone Encounter (Signed)
Last filled 05-09-22 #180 Last OV 04-28-22 Next OV 04-19-23 [facility]

## 2022-11-24 DIAGNOSIS — M5126 Other intervertebral disc displacement, lumbar region: Secondary | ICD-10-CM | POA: Diagnosis not present

## 2022-11-24 DIAGNOSIS — Z6841 Body Mass Index (BMI) 40.0 and over, adult: Secondary | ICD-10-CM | POA: Diagnosis not present

## 2022-12-02 ENCOUNTER — Other Ambulatory Visit: Payer: Self-pay | Admitting: Internal Medicine

## 2022-12-03 ENCOUNTER — Other Ambulatory Visit: Payer: Self-pay | Admitting: Internal Medicine

## 2022-12-06 DIAGNOSIS — H35712 Central serous chorioretinopathy, left eye: Secondary | ICD-10-CM | POA: Diagnosis not present

## 2022-12-06 DIAGNOSIS — H524 Presbyopia: Secondary | ICD-10-CM | POA: Diagnosis not present

## 2023-01-30 ENCOUNTER — Other Ambulatory Visit: Payer: Self-pay | Admitting: Internal Medicine

## 2023-02-06 DIAGNOSIS — H2513 Age-related nuclear cataract, bilateral: Secondary | ICD-10-CM | POA: Diagnosis not present

## 2023-02-06 DIAGNOSIS — H35712 Central serous chorioretinopathy, left eye: Secondary | ICD-10-CM | POA: Diagnosis not present

## 2023-02-15 ENCOUNTER — Other Ambulatory Visit: Payer: Self-pay | Admitting: Internal Medicine

## 2023-02-15 DIAGNOSIS — J309 Allergic rhinitis, unspecified: Secondary | ICD-10-CM

## 2023-02-27 ENCOUNTER — Other Ambulatory Visit: Payer: Self-pay | Admitting: Internal Medicine

## 2023-02-27 NOTE — Telephone Encounter (Signed)
Last filled 12-02-22 #180 Last OV 04-28-22 Next OV 04-19-23 [facility]

## 2023-04-19 ENCOUNTER — Encounter: Payer: Self-pay | Admitting: Internal Medicine

## 2023-04-19 ENCOUNTER — Ambulatory Visit (INDEPENDENT_AMBULATORY_CARE_PROVIDER_SITE_OTHER): Payer: BC Managed Care – PPO | Admitting: Internal Medicine

## 2023-04-19 VITALS — BP 130/84 | HR 75 | Temp 98.4°F | Ht 72.0 in | Wt >= 6400 oz

## 2023-04-19 DIAGNOSIS — Z114 Encounter for screening for human immunodeficiency virus [HIV]: Secondary | ICD-10-CM | POA: Diagnosis not present

## 2023-04-19 DIAGNOSIS — I10 Essential (primary) hypertension: Secondary | ICD-10-CM | POA: Diagnosis not present

## 2023-04-19 DIAGNOSIS — K760 Fatty (change of) liver, not elsewhere classified: Secondary | ICD-10-CM

## 2023-04-19 DIAGNOSIS — Z0000 Encounter for general adult medical examination without abnormal findings: Secondary | ICD-10-CM

## 2023-04-19 DIAGNOSIS — G4733 Obstructive sleep apnea (adult) (pediatric): Secondary | ICD-10-CM

## 2023-04-19 LAB — RENAL FUNCTION PANEL
Albumin: 4.7 g/dL (ref 3.5–5.2)
BUN: 11 mg/dL (ref 6–23)
CO2: 29 meq/L (ref 19–32)
Calcium: 9.3 mg/dL (ref 8.4–10.5)
Chloride: 98 meq/L (ref 96–112)
Creatinine, Ser: 0.79 mg/dL (ref 0.40–1.50)
GFR: 106.03 mL/min (ref >60.00)
Glucose, Bld: 100 mg/dL — ABNORMAL HIGH (ref 70–99)
Phosphorus: 3.8 mg/dL (ref 2.3–4.6)
Potassium: 4.1 meq/L (ref 3.5–5.1)
Sodium: 136 meq/L (ref 135–145)

## 2023-04-19 LAB — HEPATIC FUNCTION PANEL
ALT: 64 U/L — ABNORMAL HIGH (ref 0–53)
AST: 43 U/L — ABNORMAL HIGH (ref 0–37)
Albumin: 4.7 g/dL (ref 3.5–5.2)
Alkaline Phosphatase: 106 U/L (ref 39–117)
Bilirubin, Direct: 0.1 mg/dL (ref 0.0–0.3)
Total Bilirubin: 0.4 mg/dL (ref 0.2–1.2)
Total Protein: 7.2 g/dL (ref 6.0–8.3)

## 2023-04-19 LAB — CBC
HCT: 43.8 % (ref 39.0–52.0)
Hemoglobin: 14.9 g/dL (ref 13.0–17.0)
MCHC: 34 g/dL (ref 30.0–36.0)
MCV: 93.9 fL (ref 78.0–100.0)
Platelets: 287 10*3/uL (ref 150.0–400.0)
RBC: 4.66 Mil/uL (ref 4.22–5.81)
RDW: 14.2 % (ref 11.5–15.5)
WBC: 9 10*3/uL (ref 4.0–10.5)

## 2023-04-19 NOTE — Assessment & Plan Note (Signed)
Discussed working on lifestyle

## 2023-04-19 NOTE — Assessment & Plan Note (Signed)
Uses the CPAP nightly and the clonazepam to facilitate sleep

## 2023-04-19 NOTE — Assessment & Plan Note (Signed)
Discussed the beer and trying to lose weight

## 2023-04-19 NOTE — Assessment & Plan Note (Signed)
Healthy but doing all the wrong things--as usual Discussed less beer, doing some exercise, cutting back or better off stopping the cigarettes Still prefers to defer colon cancer screening Too young for PSA Still prefers no flu or COVID vaccines

## 2023-04-19 NOTE — Progress Notes (Signed)
Subjective:    Patient ID: Juan Franklin, male    DOB: Mar 04, 1976, 48 y.o.   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 90 capsule 11   losartan (COZAAR) 100 MG tablet TAKE ONE TABLET BY MOUTH ONCE DAILY EACH EVENING. 90 tablet 3   metoprolol succinate (TOPROL-XL) 100 MG 24 hr tablet TAKE 1 TABLET BY MOUTH EVERY NIGHT AT BEDTIME 90 tablet 3   Misc Natural Products (ELDERBERRY IMMUNE COMPLEX PO) Take 1 capsule by mouth in the morning and at bedtime.     montelukast (SINGULAIR) 10 MG tablet TAKE ONE TABLET BY MOUTH EVERY NIGHT AT BEDTIME 100 tablet 0   nystatin-triamcinolone ointment (MYCOLOG) Apply 1 Application topically 2 (two) times daily. 60 g 1   oxyCODONE (OXY IR/ROXICODONE) 5 MG immediate release tablet Take 5 mg by mouth every 6 (six) hours as needed for severe pain.     tiZANidine (ZANAFLEX) 2 MG tablet TAKE 1 TO 2 TABLETS (2-4 MG TOTAL) BY MOUTH 3 TIMES DAILY AS NEEDED - BETWEEN MEALS AND BEDTIME FOR MUSCLE SPASMS 60 tablet 1   No current facility-administered medications on file prior to visit.    Allergies  Allergen Reactions   Hydrocodone Bit-Homatrop Mbr Itching    Has tolerated vicodin in the past   Lisinopril Cough    Past Medical History:  Diagnosis Date   ADHD (attention deficit hyperactivity disorder)    Allergic rhinitis    Anxiety    DDD (degenerative disc disease), lumbar 12/07/2021   in [place]   GERD  (gastroesophageal reflux disease)    HNP (herniated nucleus pulposus), lumbar 05/06/2019   in [place]   Hypertension    Obstructive sleep apnea    uses cpap    Past Surgical History:  Procedure Laterality Date   LUMBAR DISC SURGERY  07/26/2008   Dr. Franky Macho   LUMBAR DISC SURGERY  03/28/2010   L4-5   LUMBAR LAMINECTOMY/DECOMPRESSION MICRODISCECTOMY Left 06/27/2019   Procedure: Left Lumbar Five Sacral One Microdiscectomy;  Surgeon: Coletta Memos, MD;  Location: [facility] OR;  Service: Neurosurgery;  Laterality: Left;  Posterior   LUMBAR LAMINECTOMY/DECOMPRESSION MICRODISCECTOMY Right 12/30/2021   Procedure: Right Lumbar Three- Four  Microdiscectomy;  Surgeon: Coletta Memos, MD;  Location[facility] OR;  Service: Neurosurgery;  Laterality: Right;  RM 18/3C// To Follow in rm 18   NASAL SINUS SURGERY  12/26/2008   Dr. Willeen Cass   WISDOM TOOTH EXTRACTION     at age 60 yrs old    Family History  Problem Relation Age of Onset   Cancer Mother        Breast    Social History   Socioeconomic History   Marital status: Divorced    Spouse name: Not on file   Number of children: 1   Years of education: Not on file   Highest education level: Not on file  Occupational History   Occupation: Solicitor  Comment: [employer]  Tobacco Use   Smoking status: Every Day    Current packs/day: 1.50    Average packs/day: 1.5 packs/day for 17.0 years (25.5 ttl pk-yrs)    Types: Cigarettes    Passive exposure: Current   Smokeless tobacco: Never   Tobacco comments:    Cut back from 2 ppd to 1 1/2 ppd  Vaping Use   Vaping status: Never Used  Substance and Sexual Activity   Alcohol use: Not Currently    Alcohol/week: 2.0 - 18.0 standard drinks of alcohol    Types: 2 - 18 Standard drinks or equivalent per week    Comment: Quit after 3 DUI's.  Now drinks 6-8 at night when he doesn't have his daughter.   Drug use: Never   Sexual activity: Yes  Other Topics Concern   Not on file  Social History  Narrative       Social Drivers of Health   Financial Resource Strain: Not on file  Food Insecurity: No Food Insecurity (03/24/2022)   Hunger Vital Sign    Worried About Running Out of Food in the Last Year: Never true    Ran Out of Food in the Last Year: Never true  Transportation Needs: No Transportation Needs (03/24/2022)   PRAPARE - Administrator, Civil Service (Medical): No    Lack of Transportation (Non-Medical): No  Physical Activity: Not on file  Stress: Not on file  Social Connections: Not on file  Intimate Partner Violence: Not on file   Review of Systems  Constitutional:  Positive for unexpected weight change. Negative for fatigue.       Wears seat belt  HENT:  Positive for hearing loss and tinnitus. Negative for dental problem.   Eyes:        Retinal lesion on left---"blister". Now improved---no treatment  Respiratory:  Negative for cough and chest tightness.        Chronic DOE--some worse with weight gain  Cardiovascular:  Positive for leg swelling. Negative for chest pain.       Occ palpitations--nothing new  Gastrointestinal:  Negative for blood in stool and constipation.       No heartburn  Endocrine: Negative for polydipsia and polyuria.  Genitourinary:  Negative for difficulty urinating and urgency.       No sex--no problem  Musculoskeletal:  Positive for arthralgias. Negative for joint swelling.       Variable back pain---does use oxycodone prn (many days)  Skin:        Chronic boils on legs--uses antibiotic prn  Allergic/Immunologic: Negative for environmental allergies and immunocompromised state.  Neurological:  Negative for dizziness, syncope, light-headedness and headaches.  Hematological:  Negative for adenopathy. Does not bruise/bleed easily.  Psychiatric/Behavioral:  Negative for dysphoric mood.        Sleeps well with CPAP nightly--uses the clonazepam at night Stress with new job---chronic mild anxiety       Objective:   Physical  Exam Constitutional:      Appearance: Normal appearance. He is obese.  HENT:     Mouth/Throat:     Pharynx: No oropharyngeal exudate or posterior oropharyngeal erythema.  Eyes:     Conjunctiva/sclera: Conjunctivae normal.     Pupils: Pupils are equal, round, and reactive to light.  Cardiovascular:     Rate and Rhythm: Normal rate and regular rhythm.     Pulses: Normal pulses.     Heart sounds: No murmur heard.    No gallop.  Pulmonary:     Effort: Pulmonary effort is normal.     Breath sounds: Normal breath sounds. No wheezing or rales.  Abdominal:     Palpations: Abdomen is soft.     Tenderness: There is no abdominal tenderness.  Musculoskeletal:     Cervical back: Neck supple.     Right lower leg: No edema.     Left lower leg: No edema.  Lymphadenopathy:     Cervical: No cervical adenopathy.  Skin:    Findings: No lesion or rash.  Neurological:     General: No focal deficit present.     Mental Status: He is alert and oriented to person, place, and time.  Psychiatric:        Mood and Affect: Mood normal.        Behavior: Behavior normal.            Assessment & Plan:

## 2023-04-19 NOTE — Assessment & Plan Note (Signed)
BP Readings from Last 3 Encounters:  04/19/23 130/84  04/28/22 130/86  04/15/22 132/88   Okay on losartan 100, metoprolol 100 daily, chlorthalidone 25

## 2023-04-20 LAB — HIV ANTIBODY (ROUTINE TESTING W REFLEX): HIV 1&2 Ab, 4th Generation: NONREACTIVE

## 2023-04-28 ENCOUNTER — Other Ambulatory Visit: Payer: Self-pay | Admitting: Internal Medicine

## 2023-05-24 ENCOUNTER — Other Ambulatory Visit: Payer: Self-pay | Admitting: Internal Medicine

## 2023-05-24 DIAGNOSIS — J309 Allergic rhinitis, unspecified: Secondary | ICD-10-CM

## 2023-05-25 ENCOUNTER — Other Ambulatory Visit: Payer: Self-pay | Admitting: Internal Medicine

## 2023-05-25 DIAGNOSIS — M5126 Other intervertebral disc displacement, lumbar region: Secondary | ICD-10-CM | POA: Diagnosis not present

## 2023-05-25 DIAGNOSIS — Z6841 Body Mass Index (BMI) 40.0 and over, adult: Secondary | ICD-10-CM | POA: Diagnosis not present

## 2023-05-25 NOTE — Telephone Encounter (Signed)
 Last filled 02-27-23 #180 Last OV 04-19-23 No Future OV [facility]

## 2023-07-17 ENCOUNTER — Other Ambulatory Visit: Payer: Self-pay | Admitting: Internal Medicine

## 2023-08-18 ENCOUNTER — Other Ambulatory Visit: Payer: Self-pay | Admitting: Internal Medicine

## 2023-08-18 NOTE — Telephone Encounter (Signed)
 Last filled  05-25-23 #180 Last OV 04-19-23 No Future OV [facility]

## 2023-11-13 ENCOUNTER — Other Ambulatory Visit: Payer: Self-pay | Admitting: Internal Medicine

## 2023-11-14 NOTE — Telephone Encounter (Signed)
 Last filled  08-18-23 #180 Last OV 04-19-23 Next OV 04-22-24 [facility]

## 2023-11-23 DIAGNOSIS — M4722 Other spondylosis with radiculopathy, cervical region: Secondary | ICD-10-CM | POA: Diagnosis not present

## 2023-11-23 DIAGNOSIS — Z6841 Body Mass Index (BMI) 40.0 and over, adult: Secondary | ICD-10-CM | POA: Diagnosis not present

## 2023-11-23 DIAGNOSIS — M5126 Other intervertebral disc displacement, lumbar region: Secondary | ICD-10-CM | POA: Diagnosis not present

## 2024-01-02 ENCOUNTER — Ambulatory Visit
Admission: RE | Admit: 2024-01-02 | Discharge: 2024-01-02 | Disposition: A | Discharge status: Home / Self Care | Source: Ambulatory Visit | Attending: Emergency Medicine | Admitting: Emergency Medicine

## 2024-01-02 VITALS — BP 151/91 | HR 63 | Temp 98.6°F | Resp 20

## 2024-01-02 DIAGNOSIS — R220 Localized swelling, mass and lump, head: Secondary | ICD-10-CM | POA: Diagnosis not present

## 2024-01-02 MED ORDER — METHYLPREDNISOLONE SODIUM SUCC 125 MG IJ SOLR
60.0000 mg | Freq: Once | INTRAMUSCULAR | Status: AC
  Administered 2024-01-02: 60 mg via INTRAMUSCULAR

## 2024-01-02 MED ORDER — PREDNISONE 20 MG PO TABS: 40.0000 mg | ORAL_TABLET | Freq: Every day | ORAL | 0 refills | Status: DC

## 2024-01-02 NOTE — Discharge Instructions (Signed)
 Your evaluated for your facial swelling, at this time unknown causes there was no external factors that was different.  You have been given an injection of methylprednisolone  to help stop the inflammatory process and ideally will see improvement within the next 30 minutes to an hour  Started tomorrow take oral prednisone  every morning with food as directed to continue the process to help clear symptoms  You may apply heat or ice over the affected area in 10 to 15-minute intervals for comfort  If your symptoms continue to persist please follow-up for reevaluation with urgent care or your primary doctor  At any point if you begin to have trouble breathing please go to the nearest emergency department for immediate evaluation

## 2024-01-02 NOTE — ED Provider Notes (Signed)
 Juan Franklin    CSN: [id number] Arrival date & time: 01/02/24  0805      History   Chief Complaint Chief Complaint  Patient presents with   Oral Swelling    Entered by patient    HPI Juan Franklin is a 48 y.o. male.   Patient presents for evaluation of left facial swelling and lower lip swelling on the right side beginning yesterday evening.  Endorses he was eating dinner when he felt a piece of food scratch against the gum and symptoms started shortly after.  Denies changes in toiletries, medication or recent travel.  Endorses he did have brown rice for dinner which is not common but he has eaten it before without issue.  Denies need for dental work.  Denies respiratory involvement, denies difficulty swallowing.  Has not attempted treatment.   Past Medical History:  Diagnosis Date   ADHD (attention deficit hyperactivity disorder)    Allergic rhinitis    Anxiety    DDD (degenerative disc disease), lumbar 12/07/2021   in [place]   GERD (gastroesophageal reflux disease)    HNP (herniated nucleus pulposus), lumbar 05/06/2019   in [place]   Hypertension    Obstructive sleep apnea    uses cpap    Patient Active Problem List   Diagnosis Date Noted   History of lumbar surgery 11/25/2021   Gout 08/23/2019   Cigarette smoker 06/03/2019   Tinea cruris 06/02/2016   Hepatic steatosis 08/14/2013   Morbid obesity (HCC) 08/09/2012   Routine general medical examination at a health care facility 08/02/2011   Essential hypertension, benign 08/02/2007   OBSTRUCTIVE SLEEP APNEA 07/26/2007   Anxiety 06/22/2006   Allergic rhinitis 06/22/2006   GERD 06/22/2006    Past Surgical History:  Procedure Laterality Date   LUMBAR DISC SURGERY  07/26/2008   Dr. Gillie   LUMBAR DISC SURGERY  03/28/2010   L4-5   LUMBAR LAMINECTOMY/DECOMPRESSION MICRODISCECTOMY Left 06/27/2019   Procedure: Left Lumbar Five Sacral One Microdiscectomy;  Surgeon: Gillie Duncans, MD;  Location[facility] OR;  Service:  Neurosurgery;  Laterality: Left;  Posterior   LUMBAR LAMINECTOMY/DECOMPRESSION MICRODISCECTOMY Right 12/30/2021   Procedure: Right Lumbar Three- Four  Microdiscectomy;  Surgeon: Gillie Duncans, MD;  Location: [facility] OR;  Service: Neurosurgery;  Laterality: Right;  RM 18/3C// To Follow in rm 18   NASAL SINUS SURGERY  12/26/2008   Dr. Blair   WISDOM TOOTH EXTRACTION     at age 5 yrs old       Home Medications    Prior to Admission medications   Medication Sig Start Date End Date Taking? Authorizing Provider  predniSONE  (DELTASONE ) 20 MG tablet Take 2 tablets (40 mg total) by mouth daily. 01/02/24  Yes Christinna Sprung R, NP  chlorthalidone  (HYGROTON ) 25 MG tablet TAKE ONE TABLET BY MOUTH ONCE DAILY 07/17/23   Letvak, Richard I, MD  clonazePAM  (KLONOPIN ) 0.5 MG tablet TAKE ONE TABLET BY MOUTH TWICE A DAY AS NEEDED 11/14/23   Jimmy Charlie FERNS, MD  gabapentin  (NEURONTIN ) 300 MG capsule TAKE ONE CAPSULE BY MOUTH EVERY MORNING AND TWO CAPS AT BEDTIME. 01/30/23   Jimmy Charlie FERNS, MD  losartan  (COZAAR ) 100 MG tablet TAKE ONE TABLET BY MOUTH ONCE DAILY EACH EVENING. 07/17/23   Jimmy Charlie FERNS, MD  metoprolol  succinate (TOPROL -XL) 100 MG 24 hr tablet TAKE ONE TABLET BY MOUTH EVERY NIGHT AT BEDTIME 04/28/23   Jimmy Charlie FERNS, MD  Misc Natural Products (ELDERBERRY IMMUNE COMPLEX PO) Take 1 capsule by mouth  in the morning and at bedtime.    [provider]  montelukast  (SINGULAIR ) 10 MG tablet TAKE ONE TABLET BY MOUTH EVERY NIGHT AT BEDTIME 05/24/23   Jimmy Charlie FERNS, MD  nystatin -triamcinolone  ointment (MYCOLOG) Apply 1 Application topically 2 (two) times daily. 04/22/22   Jimmy Charlie FERNS, MD  oxyCODONE (OXY IR/ROXICODONE) 5 MG immediate release tablet Take 5 mg by mouth every 6 (six) hours as needed for severe pain. 12/21/21   [provider]  tiZANidine  (ZANAFLEX ) 2 MG tablet TAKE 1 TO 2 TABLETS (2-4 MG TOTAL) BY MOUTH 3 TIMES DAILY AS NEEDED - BETWEEN MEALS AND BEDTIME FOR MUSCLE  SPASMS 05/09/22   Jimmy Charlie FERNS, MD    Family History Family History  Problem Relation Age of Onset   Cancer Mother        Breast    Social History Social History   Tobacco Use   Smoking status: Every Day    Current packs/day: 1.50    Average packs/day: 1.5 packs/day for 17.0 years (25.5 ttl pk-yrs)    Types: Cigarettes    Passive exposure: Current   Smokeless tobacco: Never   Tobacco comments:    Cut back from 2 ppd to 1 1/2 ppd  Vaping Use   Vaping status: Never Used  Substance Use Topics   Alcohol use: Not Currently    Alcohol/week: 2.0 - 18.0 standard drinks of alcohol    Types: 2 - 18 Standard drinks or equivalent per week    Comment: Quit after 3 DUI's.  Now drinks 6-8 at night when he doesn't have his daughter.   Drug use: Never     Allergies   Hydrocodone bit-homatrop mbr and Lisinopril   Review of Systems Review of Systems   Physical Exam Triage Vital Signs ED Triage Vitals  Encounter Vitals Group     BP 01/02/24 0819 (!) 151/91     Girls Systolic BP Percentile --      Girls Diastolic BP Percentile --      Boys Systolic BP Percentile --      Boys Diastolic BP Percentile --      Pulse Rate 01/02/24 0819 63     Resp 01/02/24 0819 20     Temp 01/02/24 0819 98.6 F (37 C)     Temp Source 01/02/24 0819 Oral     SpO2 01/02/24 0819 97 %     Weight --      Height --      Head Circumference --      Peak Flow --      Pain Score 01/02/24 0817 0     Pain Loc --      Pain Education --      Exclude from Growth Chart --    No data found.  Updated Vital Signs BP (!) 151/91 (BP Location: Left Arm)   Pulse 63   Temp 98.6 F (37 C) (Oral)   Resp 20   SpO2 97%   Visual Acuity Right Eye Distance:   Left Eye Distance:   Bilateral Distance:    Right Eye Near:   Left Eye Near:    Bilateral Near:     Physical Exam Constitutional:      Appearance: Normal appearance.  HENT:     Head:     Comments: Mild to moderate swelling of the left cheek,  nontender, soft    Mouth/Throat:     Comments: Moderate lower lip swelling, pharynx clear without obstruction, no cervical adenopathy  noted Pulmonary:     Effort: Pulmonary effort is normal.  Neurological:     Mental Status: He is alert and oriented to person, place, and time. Mental status is at baseline.      UC Treatments / Results  Labs (all labs ordered are listed, but only abnormal results are displayed) Labs Reviewed - No data to display  EKG   Radiology No results found.  Procedures Procedures (including critical care time)  Medications Ordered in UC Medications  methylPREDNISolone  sodium succinate (SOLU-MEDROL ) 125 mg/2 mL injection 60 mg (has no administration in time range)    Initial Impression / Assessment and Plan / UC Course  I have reviewed the triage vital signs and the nursing notes.  Pertinent labs & imaging results that were available during my care of the patient were reviewed by me and considered in my medical decision making (see chart for details).  Facial swelling, lip swelling  Significant swelling present on exam, unknown etiology, no respiratory involvement, no abscess noted on exam low suspicion for infectious cause, at this time do not believe related to the teeth, will move forward with symptomatic treatment, methylprednisolone  IM given and prescribed oral prednisone  for home use recommended supportive care and advised follow-up if symptoms persist, given strict ER precautions for any respiratory involvement Final Clinical Impressions(s) / UC Diagnoses   Final diagnoses:  Facial swelling  Lip swelling     Discharge Instructions      Your evaluated for your facial swelling, at this time unknown causes there was no external factors that was different.  You have been given an injection of methylprednisolone  to help stop the inflammatory process and ideally will see improvement within the next 30 minutes to an hour  Started tomorrow take  oral prednisone  every morning with food as directed to continue the process to help clear symptoms  You may apply heat or ice over the affected area in 10 to 15-minute intervals for comfort  If your symptoms continue to persist please follow-up for reevaluation with urgent care or your primary doctor  At any point if you begin to have trouble breathing please go to the nearest emergency department for immediate evaluation   ED Prescriptions     Medication Sig Dispense Auth. Provider   predniSONE  (DELTASONE ) 20 MG tablet Take 2 tablets (40 mg total) by mouth daily. 10 tablet Keller Mikels R, NP      PDMP not reviewed this encounter.   Teresa Shelba SAUNDERS, [place] 01/02/24 [phone]

## 2024-01-02 NOTE — ED Triage Notes (Signed)
 Patient reports bilateral jaw swelling, lip swelling and tongue swelling that started last night. Patient has not taken anything for symptoms. Patient denies CP and shortness of breath.

## 2024-01-23 DIAGNOSIS — J069 Acute upper respiratory infection, unspecified: Secondary | ICD-10-CM | POA: Diagnosis not present

## 2024-02-05 ENCOUNTER — Other Ambulatory Visit: Payer: Self-pay

## 2024-02-05 MED ORDER — CLONAZEPAM 0.5 MG PO TABS: 0.5000 mg | ORAL_TABLET | Freq: Two times a day (BID) | ORAL | 0 refills | Status: AC | PRN

## 2024-02-05 NOTE — Telephone Encounter (Signed)
 Last written 11-14-23 #180 Last OV 04-19-23 Next OV 04-22-24 [facility]

## 2024-04-02 ENCOUNTER — Encounter: Payer: Self-pay | Admitting: Family

## 2024-04-02 ENCOUNTER — Other Ambulatory Visit: Payer: Self-pay

## 2024-04-02 VITALS — BP 134/86 | HR 83 | Temp 97.9°F | Ht 72.0 in | Wt >= 6400 oz

## 2024-04-02 DIAGNOSIS — B9689 Other specified bacterial agents as the cause of diseases classified elsewhere: Secondary | ICD-10-CM | POA: Diagnosis not present

## 2024-04-02 DIAGNOSIS — J22 Unspecified acute lower respiratory infection: Secondary | ICD-10-CM | POA: Diagnosis not present

## 2024-04-02 MED ORDER — AMOXICILLIN-POT CLAVULANATE 875-125 MG PO TABS: 1.0000 | ORAL_TABLET | Freq: Two times a day (BID) | ORAL | 0 refills | Status: DC

## 2024-04-02 NOTE — Progress Notes (Signed)
 "  Established Patient Office Visit  Subjective:      CC:  Chief Complaint  Patient presents with   Acute Visit    Reports chest congestion, cough since Thanksgiving    HPI: Juan Franklin is a 49 y.o. male presenting on 04/02/2024 for Acute Visit (Reports chest congestion, cough since Thanksgiving) .  Discussed the use of AI scribe software for clinical note transcription with the patient, who gave verbal consent to proceed.  History of Present Illness Juan Franklin is a 49 year old male who presents with persistent respiratory symptoms.  He has been experiencing persistent respiratory symptoms since around Thanksgiving, with intermittent episodes nearly resolving before worsening again. A notable exacerbation last week prompted him to seek medical attention.  He coughs up approximately a quarter cup of green and yellow sputum and experiences drainage that triggers coughing. Symptoms are present in both his chest and nasal area. He experiences wheezing and shortness of breath but denies sinus pressure, ear pain, sore throat, fever, and headache.  He has been using over-the-counter treatments including Mucinex, a neti bottle for sinus rinsing, vitamin C, zinc, and elderberry supplements without significant relief. He also takes montelukast , which has been beneficial for his allergies, especially after sinus surgery.  His past medical history includes gout and a history of sinus infections post-surgery, which improved with montelukast . He denies having asthma and has no known allergies to antibiotics, but prefers to avoid Z-Paks due to perceived ineffectiveness.  In the review of symptoms, he reports sneezing, particularly last night, and acknowledges inflammation in his nasal passages.         Social history:  Relevant past medical, surgical, family and social history reviewed and updated as indicated. Interim medical history since our last visit reviewed.  Allergies and  medications reviewed and updated.  DATA REVIEWED: CHART IN EPIC     ROS: Negative unless specifically indicated above in HPI.   Current Medications[1]        Objective:        BP 134/86 (BP Location: Left Arm, Patient Position: Sitting, Cuff Size: Large)   Pulse 83   Temp 97.9 F (36.6 C) (Temporal)   Ht 6' (1.829 m)   Wt (!) 421 lb 9.6 oz (191.2 kg)   SpO2 98%   BMI 57.18 kg/m   Physical Exam HEENT: Fluid behind eardrum. Pharynx erythematous. Nasal mucosa inflamed. NECK: Cervical lymphadenopathy.  Wt Readings from Last 3 Encounters:  04/02/24 (!) 421 lb 9.6 oz (191.2 kg)  04/19/23 (!) 413 lb (187.3 kg)  04/28/22 (!) 381 lb (172.8 kg)    Physical Exam Vitals reviewed.  Constitutional:      General: He is not in acute distress.    Appearance: Normal appearance. He is obese. He is not ill-appearing, toxic-appearing or diaphoretic.  HENT:     Head: Normocephalic.     Right Ear: Tympanic membrane normal.     Left Ear: A middle ear effusion (clear) is present.     Nose: Nasal tenderness and congestion present.     Right Sinus: No maxillary sinus tenderness or frontal sinus tenderness.     Left Sinus: No maxillary sinus tenderness or frontal sinus tenderness.     Mouth/Throat:     Mouth: Mucous membranes are moist.     Pharynx: Postnasal drip present.  Eyes:     Pupils: Pupils are equal, round, and reactive to light.  Cardiovascular:     Rate and Rhythm: Normal rate and regular  rhythm.  Pulmonary:     Effort: Pulmonary effort is normal.     Breath sounds: Normal breath sounds. No wheezing.  Musculoskeletal:        General: Normal range of motion.     Cervical back: Normal range of motion.  Neurological:     General: No focal deficit present.     Mental Status: He is alert and oriented to person, place, and time. Mental status is at baseline.  Psychiatric:        Mood and Affect: Mood normal.        Behavior: Behavior normal.        Thought Content:  Thought content normal.        Judgment: Judgment normal.          Results   Assessment & Plan:   Assessment and Plan Assessment & Plan Acute lower respiratory infection Intermittent cough with green and yellow sputum production, wheezing, and shortness of breath. Symptoms began around Thanksgiving and worsened last week. No fever, headache, or sore throat. Lung sounds localized to nose and mouth, indicating upper respiratory involvement. No asthma. Over-the-counter treatments like Mucinex and sinus rinse have not provided relief. Z-Pak is not recommended due to resistance and lack of efficacy for current symptoms. -rx augmentin  875/125 mg po bid x 10 days   Allergic rhinitis with serous otitis media Nasal inflammation, sneezing, and fluid behind the eardrum. No ear pain or sinus pressure. Symptoms suggestive of allergy-related etiology. Montelukast  has been effective for past sinus infections. Zyrtec, Allegra, or Claritin recommended for fluid in ears and sneezing. Flonase suggested for nasal drainage and inflammation, especially during allergy season. - Start Zyrtec, Allegra, or Claritin for fluid in ears and sneezing. - Use Flonase for nasal drainage and inflammation, especially during allergy season.        Return if symptoms worsen or fail to improve.     Ginger Patrick, MSN, APRN, FNP-C [facility]        [1]  Current Outpatient Medications:    amoxicillin -clavulanate (AUGMENTIN ) 875-125 MG tablet, Take 1 tablet by mouth 2 (two) times daily., Disp: 20 tablet, Rfl: 0   chlorthalidone  (HYGROTON ) 25 MG tablet, TAKE ONE TABLET BY MOUTH ONCE DAILY, Disp: 90 tablet, Rfl: 3   clonazePAM  (KLONOPIN ) 0.5 MG tablet, Take 1 tablet (0.5 mg total) by mouth 2 (two) times daily as needed., Disp: 180 tablet, Rfl: 0   gabapentin  (NEURONTIN ) 300 MG capsule, TAKE ONE CAPSULE BY MOUTH EVERY MORNING AND TWO CAPS AT BEDTIME., Disp: 90 capsule, Rfl: 11    losartan  (COZAAR ) 100 MG tablet, TAKE ONE TABLET BY MOUTH ONCE DAILY EACH EVENING., Disp: 90 tablet, Rfl: 3   metoprolol  succinate (TOPROL -XL) 100 MG 24 hr tablet, TAKE ONE TABLET BY MOUTH EVERY NIGHT AT BEDTIME, Disp: 90 tablet, Rfl: 3   Misc Natural Products (ELDERBERRY IMMUNE COMPLEX PO), Take 1 capsule by mouth in the morning and at bedtime., Disp: , Rfl:    montelukast  (SINGULAIR ) 10 MG tablet, TAKE ONE TABLET BY MOUTH EVERY NIGHT AT BEDTIME, Disp: 100 tablet, Rfl: 3   nystatin -triamcinolone  ointment (MYCOLOG), Apply 1 Application topically 2 (two) times daily., Disp: 60 g, Rfl: 1   oxyCODONE (OXY IR/ROXICODONE) 5 MG immediate release tablet, Take 5 mg by mouth every 6 (six) hours as needed for severe pain., Disp: , Rfl:    tiZANidine  (ZANAFLEX ) 2 MG tablet, TAKE 1 TO 2 TABLETS (2-4 MG TOTAL) BY MOUTH 3 TIMES DAILY AS NEEDED -  BETWEEN MEALS AND BEDTIME FOR MUSCLE SPASMS, Disp: 60 tablet, Rfl: 1  "

## 2024-04-11 ENCOUNTER — Other Ambulatory Visit: Payer: Self-pay

## 2024-04-22 ENCOUNTER — Encounter

## 2024-04-30 ENCOUNTER — Ambulatory Visit: Payer: Self-pay

## 2024-04-30 ENCOUNTER — Ambulatory Visit: Admitting: Primary Care

## 2024-04-30 ENCOUNTER — Ambulatory Visit
Admission: RE | Admit: 2024-04-30 | Discharge: 2024-04-30 | Disposition: A | Discharge status: Home / Self Care | Source: Ambulatory Visit | Attending: Primary Care | Admitting: Primary Care

## 2024-04-30 ENCOUNTER — Encounter: Payer: Self-pay | Admitting: Primary Care

## 2024-04-30 VITALS — BP 132/82 | HR 70 | Temp 98.1°F | Ht 72.0 in | Wt >= 6400 oz

## 2024-04-30 DIAGNOSIS — R0981 Nasal congestion: Secondary | ICD-10-CM | POA: Diagnosis not present

## 2024-04-30 DIAGNOSIS — R053 Chronic cough: Secondary | ICD-10-CM | POA: Diagnosis not present

## 2024-04-30 MED ORDER — PREDNISONE 20 MG PO TABS: ORAL_TABLET | ORAL | 0 refills | Status: AC

## 2024-04-30 NOTE — Patient Instructions (Signed)
 Complete xray(s) prior to leaving today. I will notify you of your results once received.  Start prednisone  20 mg tablets. Take 2 tablets by mouth once daily in the morning for 5 days.  Start taking Zyrtec every day for drainage.  Consider resuming Flonase or something similarly such as Nasacort  or Nasonex  It was a pleasure meeting you!

## 2024-04-30 NOTE — Telephone Encounter (Signed)
 FYI Only or Action Required?: Action required by provider: medication refill request, update on patient condition, and requesting if another round of antibiotics needed.  Patient was last seen in primary care on 04/02/2024 by Corwin Antu, FNP.  Called Nurse Triage reporting Cough.  Symptoms began several days ago.  Interventions attempted: Prescription medications: augmentin  and Rest, hydration, or home remedies.  Symptoms are: unchanged.  Triage Disposition: See HCP Within 4 Hours (Or PCP Triage)  Patient/caregiver understands and will follow disposition?: No, wishes to speak with PCP   Please advise if another appt needed. Patient last seen 04/02/24 and took course of augmentin . Reports feeling better 1-2 days and coughing started with green mucus and wheezing at times. Reports worse in the am upon awakening. Attention: IVAR Corwin, FNP            Reason for Disposition  Wheezing is present  Answer Assessment - Initial Assessment Questions Last OV 04/02/24. Completed course of augmentin  felt better 1-2 days and now coughing up green mucus. Worse in the mornings. Requesting if another round of antibiotics can be prescribed. Please advise.        1. ONSET: When did the cough begin?      On going since 04/02/24 and now productive  2. SEVERITY: How bad is the cough today?      moderate 3. SPUTUM: Describe the color of your sputum (e.g., none, dry cough; clear, white, yellow, green)     Green  4. HEMOPTYSIS: Are you coughing up any blood? If Yes, ask: How much? (e.g., flecks, streaks, tablespoons, etc.)     Na  5. DIFFICULTY BREATHING: Are you having difficulty breathing? If Yes, ask: How bad is it? (e.g., mild, moderate, severe)      Denies  6. FEVER: Do you have a fever? If Yes, ask: What is your temperature, how was it measured, and when did it start?     No  10. OTHER SYMPTOMS: Do you have any other symptoms? (e.g., runny nose, wheezing, chest pain)      Cough with green mucus/phlegm esp in the mornings. Some wheezing. No chest pain no difficulty breathing no fever.  Sneezing has completed course of augmentin  and felt better 1-2 days and now coughing has returned  Protocols used: Cough - Acute Productive-A-AH

## 2024-04-30 NOTE — Telephone Encounter (Signed)
 Spoke to pt. Made him an appt with Mallie Gaskins, NP today at 320 pm.

## 2024-05-02 ENCOUNTER — Ambulatory Visit: Payer: Self-pay | Admitting: Primary Care

## 2024-06-18 ENCOUNTER — Encounter
